# Patient Record
Sex: Male | Born: 1990 | Race: White | Hispanic: Yes | Marital: Married | Smoking: Never smoker
Health system: Southern US, Community
[De-identification: ages and names within clinical notes are randomized; demographics above are authoritative.]

## PROBLEM LIST (undated history)

## (undated) DIAGNOSIS — N3289 Other specified disorders of bladder: Secondary | ICD-10-CM

## (undated) DIAGNOSIS — K589 Irritable bowel syndrome without diarrhea: Secondary | ICD-10-CM

## (undated) HISTORY — DX: Other specified disorders of bladder: N32.89

## (undated) HISTORY — DX: Irritable bowel syndrome, unspecified: K58.9

---
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
## 2011-04-02 NOTE — Discharge Summary (Signed)
NAMEMAVERYK, Brandon White NO.:  [id number]   MEDICAL RECORD NO.:  [id number]          PATIENT TYPE:  INP   LOCATION:  6124                         FACILITY:  [facility]   PHYSICIAN:  Joesph July, MD    DATE OF BIRTH:  [birth year]   DATE OF ADMISSION:  09/27/2008  DATE OF DISCHARGE:  09/29/2008                               DISCHARGE SUMMARY   REASON FOR HOSPITALIZATION:  Transaminitis and abdominal pain.   SIGNIFICANT FINDINGS:  The patient presented with positive monospot  test, increased LFTs, jaundice, and abdominal pain.  The patient had  zero fevers throughout hospitalization.   INITIAL LABS:  White blood cell 22.2, hemoglobin/hematocrit 13.3/39.9,  platelets 269, T bili 7.6, alk phos 774, AST 191, ALT 199.  Labs  continue to remain stable over the course of hospitalization with a T  bili of 6.1, direct bili of 3.8, AST of 184, ALT 204, and total protein  is 7.1.  All tests were trending downward at discharge.  Pain greatly  decreased at discharge.   TREATMENT:  Maintenance IV fluids and observation.  Operation and  procedures.  Ultrasound showed a gallbladder thickness to 9 mm and  hepatosplenomegaly.   FINAL DIAGNOSIS:  Epstein-Barr virus-associated acute acalculous  cholecystitis.   DISCHARGE MEDICATIONS AND INSTRUCTIONS:  Continue low-fat diet.  Avoid  fatty and greasy food.  Limit Tylenol usage until return of liver  function tests to normal and avoid contact sports for 6 weeks and once  cleared by his primary care physician.  Pending results, blood culture,  and hepatitis C RNA.  Follow up with Dr. Chestine Spore at [phone] and at  [facility] on October 03, 2008, at 10:20 a.m. [phone].  He will  also have follow-up in the GI clinic after discharge.   DISCHARGE WEIGHT:  60 kilograms.   DISCHARGE CONDITION:  Improved.      Pediatrics Resident      Joesph July, MD  Electronically Signed   PR/MEDQ  D:  09/29/2008  T:  09/30/2008  Job:   [id number]   cc:   Marylu Lund L. Avis Epley, M.D.  Dr. Chestine Spore

## 2011-04-05 NOTE — Op Note (Signed)
Brandon White, Brandon White NO.:  [id number]   MEDICAL RECORD NO.:  [id number]          PATIENT TYPE:  INP   LOCATION:  6126                         FACILITY:  [facility]   PHYSICIAN:  Leonia Corona, M.D.  DATE OF BIRTH:  Oct 16, 1991   DATE OF PROCEDURE:  DATE OF DISCHARGE:                                 OPERATIVE REPORT   PREOPERATIVE DIAGNOSIS:  Acute appendicitis.   POSTOPERATIVE DIAGNOSIS:  Acute appendicitis.   PROCEDURE PERFORMED:  Open appendectomy.   SURGEON:  Leonia Corona, M.D.   ASSISTANT:  Nurse.   ANESTHESIA:  General endotracheal tube anesthesia.   INDICATIONS FOR THE PROCEDURE:  This 20 year old male child was evaluated  for abdominal pain.  A high suspicion of appendicitis was made on clinical  exam.  The diagnosis of acute appendicitis was conformed by CT scan, hence  the indications for the procedure.   PROCEDURE IN DETAIL:  The patient was brought to the operating room and  placed supine on the operating table.  General endotracheal tube anesthesia  was given.  The right lower quadrant of the abdomen and the surrounding area  of the abdominal wall skin were prepped and draped in the usual manner.  The  incision was centered at the McBurney's point in the right lower quadrant  and extended on either side for about 1.5-2 cm.  The incision was deepened  through the subcutaneous tissue using electrocautery until the external  aponeurosis was reached, which was incised in the line of its fibers with  the help of knife and scissors.  The internal oblique and transversus  abdominis muscles were split along its fibers with the help of blunt  hemostat and stretched with the help of retractors until the peritoneum was  visualized, which was held between two hemostats and incised in between with  scissors.  A small opening into the peritoneal cavity was made and extended  with the help of scissors.  Just beneath the incision, the cecum was  visualized,  which was covered with fibrinous exudate, indicating  inflammatory process.  The cecum was held up with Babcock forceps and  partially delivered out of the incision.  The teniae were followed which led  to a very severely inflamed appendix which was wrapped and covered with  omentum.  A lot of inflammatory fibrinous exudate was present around the  appendix.  The mesoappendix was edematous, and the tip of the appendix was  bulbous and swollen.  The appendix was delivered out of the incision, along  with partial delivery of the cecum.  The mesoappendix was divided between  clamps and ligated using 2-0 silk until the base of the appendix was  cleared.  The base of the appendix was crushed and clamped above the base.  The base was ligated using 2-0 Vicryl, and the appendix was divided above  the location and removed from the field.  The mucosa of the appendicular  stump was cauterized.  A pursestring suture was placed on the cecal wall  around the base of the appendix, and the base of the appendix was buried  under the pursestring suture.  Cecum was delivered back into the peritoneal  cavity.  A thorough irrigation of the peritoneal cavity was done using warm  saline until the returning fluid was clear.  No evidence of active bleeding  or oozing was noted.  Abdomen was then closed in layers, the peritoneum  using 2-0 Vicryl running stitch.  Internal oblique and transversus abdominis  muscles were approximated using single interrupted suture of 2-0 Vicryl.  The external oblique was repaired using 2-0 Vicryl interrupted stitches.  Wound was irrigated, and approximately 10 mL of 0.25% Marcaine with  epinephrine was insufflated in and around the incision for postoperative  pain control.  The skin was then closed with 4-0 Monocryl subcuticular  stitch.  Steri-Strips were applied and covered with sterile gauze and  Tegaderm dressings.  The patient tolerated the very procedure well.  It was  smooth  and uneventful.  The patient was later extubated and transported to  the recovery room in good stable condition.      SF/MEDQ  D:  12/07/2004  T:  12/07/2004  Job:  [id number]   cc:   Georgann Housekeeper, MD  Fax: [phone]

## 2011-04-05 NOTE — Discharge Summary (Signed)
NAMEAYRTON, MCVAY NO.:  [id number]   MEDICAL RECORD NO.:  [id number]          PATIENT TYPE:  INP   LOCATION:  6126                         FACILITY:  [facility]   PHYSICIAN:  Pediatrics Resident    DATE OF BIRTH:  01-12-1991   DATE OF ADMISSION:  12/06/2004  DATE OF DISCHARGE:  12/08/2004                                 DISCHARGE SUMMARY   REASON FOR ADMISSION:  This is a 20 year old male otherwise healthy admitted  status post appendectomy.   SIGNIFICANT FINDINGS:  Admission labs revealed urine within normal limits  except for a spec. grav. of 1.  Chemistries were within normal limits.  CBC  showed white count 17.9, hemoglobin 15.4, hematocrit 44.7, and platelets  375.  CT obtained on January 19 showed dilated, inflamed appendix compatible  with appendicitis.   TREATMENT:  Open appendectomy, IV fluids, Unasyn 1.5 grams IV q.6h. x 4  doses total, morphine p.r.n. for pain, schedule Tylenol with codeine q.8h.  and Tylenol p.r.n. for pain.   OPERATIONS AND PROCEDURES:  Open appendectomy without complications.   FINAL DIAGNOSIS:  Appendicitis.   DISCHARGE MEDICATIONS AND INSTRUCTIONS:  Tylenol 650 mg p.o. q.4-6h. p.r.n.  pain, if pain not relieved by regular Tylenol, can use Tylenol with codeine  elixir 10 mL p.o. q.6-8h. p.r.n.  Return to the ED for temperatures greater  than 38.5 or any other symptoms of concern.   PENDING RESULTS OR ISSUES TO BE FOLLOWED:  None.   FOLLOW UP:  Pediatric surgery in ten days, call for appointment.   CONDITION ON DISCHARGE:  Stable.       PR/MEDQ  D:  12/08/2004  T:  12/08/2004  Job:  [id number]

## 2011-08-20 LAB — COMPREHENSIVE METABOLIC PANEL
ALT: 199 — ABNORMAL HIGH
ALT: 263 — ABNORMAL HIGH
AST: 188 — ABNORMAL HIGH
AST: 191 — ABNORMAL HIGH
Albumin: 2.8 — ABNORMAL LOW
Albumin: 2.9 — ABNORMAL LOW
Albumin: 3.6
Alkaline Phosphatase: 358 — ABNORMAL HIGH
Alkaline Phosphatase: 774 — ABNORMAL HIGH
Alkaline Phosphatase: 825 — ABNORMAL HIGH
BUN: 5 — ABNORMAL LOW
BUN: 6
BUN: 7
BUN: 8
CO2: 25
CO2: 26
CO2: 28
Calcium: 8.9
Calcium: 9
Calcium: 9.3
Chloride: 101
Chloride: 101
Chloride: 98
Creatinine, Ser: 0.71
Creatinine, Ser: 0.8
Creatinine, Ser: 0.83
Creatinine, Ser: 0.86
Glucose, Bld: 83
Glucose, Bld: 84
Glucose, Bld: 91
Potassium: 4.6
Potassium: 4.6
Potassium: 5.9 — ABNORMAL HIGH
Sodium: 133 — ABNORMAL LOW
Sodium: 136
Total Bilirubin: 3.9 — ABNORMAL HIGH
Total Bilirubin: 4 — ABNORMAL HIGH
Total Bilirubin: 7.6 — ABNORMAL HIGH
Total Protein: 6.8
Total Protein: 6.9
Total Protein: 7.1

## 2011-08-20 LAB — DIFFERENTIAL
Band Neutrophils: 0
Band Neutrophils: 11 — ABNORMAL HIGH
Basophils Absolute: 0
Basophils Absolute: 0
Basophils Relative: 0
Basophils Relative: 0
Basophils Relative: 0
Basophils Relative: 1
Basophils Relative: 2 — ABNORMAL HIGH
Blasts: 0
Blasts: 0
Blasts: 0
Eosinophils Absolute: 0
Eosinophils Absolute: 0.2
Eosinophils Absolute: 0.2
Eosinophils Relative: 0
Eosinophils Relative: 1
Eosinophils Relative: 1
Eosinophils Relative: 1
Lymphocytes Relative: 43
Lymphocytes Relative: 57 — ABNORMAL HIGH
Lymphocytes Relative: 73 — ABNORMAL HIGH
Lymphocytes Relative: 74 — ABNORMAL HIGH
Lymphocytes Relative: 85 — ABNORMAL HIGH
Lymphs Abs: 18.9 — ABNORMAL HIGH
Lymphs Abs: 3.7
Lymphs Abs: 9 — ABNORMAL HIGH
Metamyelocytes Relative: 0
Metamyelocytes Relative: 0
Monocytes Absolute: 0.4
Monocytes Absolute: 0.8
Monocytes Absolute: 2.1 — ABNORMAL HIGH
Monocytes Relative: 2 — ABNORMAL LOW
Monocytes Relative: 6
Monocytes Relative: 7
Monocytes Relative: 9
Myelocytes: 0
Myelocytes: 0
Neutro Abs: 2.7
Neutro Abs: 3.2
Neutro Abs: 3.4
Neutro Abs: 3.8
Neutro Abs: 4
Neutro Abs: 4.9
Neutrophils Relative %: 12 — ABNORMAL LOW
Neutrophils Relative %: 17 — ABNORMAL LOW
Neutrophils Relative %: 18 — ABNORMAL LOW
Neutrophils Relative %: 20 — ABNORMAL LOW
Neutrophils Relative %: 31 — ABNORMAL LOW
Neutrophils Relative %: 37 — ABNORMAL LOW
Promyelocytes Absolute: 0
Promyelocytes Absolute: 0
Promyelocytes Absolute: 0
Smear Review: ADEQUATE
nRBC: 0
nRBC: 0

## 2011-08-20 LAB — GC/CHLAMYDIA PROBE AMP, URINE
Chlamydia, Swab/Urine, PCR: NEGATIVE
GC Probe Amp, Urine: NEGATIVE

## 2011-08-20 LAB — CBC
HCT: 35.7 — ABNORMAL LOW
HCT: 36.6
HCT: 38.6
HCT: 39.3
HCT: 39.8
HCT: 46.1
Hemoglobin: 12.8
Hemoglobin: 13.3
Hemoglobin: 13.5
Hemoglobin: 13.7
Hemoglobin: 14.9
MCHC: 32.3
MCHC: 33.9
MCHC: 33.9
MCHC: 35.5
MCV: 83.6
MCV: 85.8
MCV: 86.4
MCV: 88.2
MCV: 92.4
Platelets: 181
Platelets: 269
Platelets: 389
RBC: 3.98
RBC: 4.18
RBC: 4.27
RBC: 4.51
RBC: 4.55
RBC: 5.38
RDW: 14
RDW: 15
RDW: 15.4
WBC: 15.8 — ABNORMAL HIGH
WBC: 22.2 — ABNORMAL HIGH
WBC: 8.6

## 2011-08-20 LAB — URINALYSIS, ROUTINE W REFLEX MICROSCOPIC
Glucose, UA: NEGATIVE
Hgb urine dipstick: NEGATIVE
Ketones, ur: NEGATIVE
Nitrite: NEGATIVE
Protein, ur: NEGATIVE
Specific Gravity, Urine: 1.02
Urobilinogen, UA: 1
pH: 6

## 2011-08-20 LAB — HEPATIC FUNCTION PANEL
ALT: 195 — ABNORMAL HIGH
ALT: 204 — ABNORMAL HIGH
AST: 185 — ABNORMAL HIGH
Albumin: 2.6 — ABNORMAL LOW
Alkaline Phosphatase: 840 — ABNORMAL HIGH
Alkaline Phosphatase: 906 — ABNORMAL HIGH
Indirect Bilirubin: 2.3 — ABNORMAL HIGH
Total Bilirubin: 6.4 — ABNORMAL HIGH
Total Protein: 7.1

## 2011-08-20 LAB — URINE CULTURE
Colony Count: NO GROWTH
Culture: NO GROWTH

## 2011-08-20 LAB — HEPATITIS B CORE ANTIBODY, IGM: Hep B C IgM: NEGATIVE

## 2011-08-20 LAB — HEPATITIS C VRS RNA DETECT BY PCR-QUAL: Hepatitis C Vrs RNA by PCR-Qual: NEGATIVE

## 2011-08-20 LAB — RAPID URINE DRUG SCREEN, HOSP PERFORMED
Amphetamines: NOT DETECTED
Benzodiazepines: NOT DETECTED
Cocaine: NOT DETECTED
Tetrahydrocannabinol: NOT DETECTED

## 2011-08-20 LAB — CULTURE, BLOOD (SINGLE): Culture: NO GROWTH

## 2011-08-20 LAB — URINE MICROSCOPIC-ADD ON

## 2011-08-20 LAB — BILIRUBIN, FRACTIONATED(TOT/DIR/INDIR)
Bilirubin, Direct: 4.7 — ABNORMAL HIGH
Indirect Bilirubin: 3.6 — ABNORMAL HIGH

## 2011-08-20 LAB — ACETAMINOPHEN LEVEL: Acetaminophen (Tylenol), Serum: <10 — ABNORMAL LOW

## 2011-08-20 LAB — RETICULOCYTES
RBC.: 4.44
Retic Count, Absolute: 71
Retic Ct Pct: 1.6

## 2011-08-20 LAB — LIPASE, BLOOD: Lipase: 24

## 2011-08-20 LAB — CMV ABS, IGG+IGM (CYTOMEGALOVIRUS): CMV IgM: <8 AU/mL (ref <30.0)

## 2011-08-20 LAB — PROTIME-INR: INR: 1

## 2011-08-20 LAB — MONONUCLEOSIS SCREEN
Mono Screen: POSITIVE — AB
Mono Screen: POSITIVE — AB

## 2011-08-20 LAB — EBV AB TO VIRAL CAPSID AG PNL, IGG+IGM
EBV VCA IgG: 0.5
EBV VCA IgM: 6.81 — ABNORMAL HIGH

## 2012-06-04 ENCOUNTER — Other Ambulatory Visit: Payer: Self-pay | Admitting: Family Medicine

## 2012-06-04 DIAGNOSIS — R1011 Right upper quadrant pain: Secondary | ICD-10-CM

## 2012-06-05 ENCOUNTER — Ambulatory Visit
Admission: RE | Admit: 2012-06-05 | Discharge: 2012-06-05 | Disposition: A | Discharge status: Home / Self Care | Payer: 59 | Source: Ambulatory Visit | Attending: Family Medicine | Admitting: Family Medicine

## 2012-06-05 DIAGNOSIS — R1011 Right upper quadrant pain: Secondary | ICD-10-CM

## 2018-05-28 DIAGNOSIS — R030 Elevated blood-pressure reading, without diagnosis of hypertension: Secondary | ICD-10-CM | POA: Diagnosis not present

## 2018-05-28 DIAGNOSIS — K589 Irritable bowel syndrome without diarrhea: Secondary | ICD-10-CM | POA: Diagnosis not present

## 2018-05-28 DIAGNOSIS — R1013 Epigastric pain: Secondary | ICD-10-CM | POA: Diagnosis not present

## 2019-10-08 ENCOUNTER — Other Ambulatory Visit: Payer: Self-pay

## 2019-10-08 DIAGNOSIS — Z20822 Contact with and (suspected) exposure to covid-19: Secondary | ICD-10-CM

## 2019-10-11 LAB — NOVEL CORONAVIRUS, NAA: SARS-CoV-2, NAA: NOT DETECTED

## 2019-11-25 ENCOUNTER — Ambulatory Visit: Payer: 59 | Attending: Internal Medicine

## 2020-10-13 ENCOUNTER — Emergency Department (HOSPITAL_COMMUNITY)
Admission: EM | Admit: 2020-10-13 | Discharge: 2020-10-14 | Disposition: A | Discharge status: Home / Self Care | Payer: 59 | Attending: Emergency Medicine | Admitting: Emergency Medicine

## 2020-10-13 ENCOUNTER — Other Ambulatory Visit: Payer: Self-pay

## 2020-10-13 ENCOUNTER — Encounter (HOSPITAL_COMMUNITY): Payer: Self-pay

## 2020-10-13 DIAGNOSIS — N50811 Right testicular pain: Secondary | ICD-10-CM | POA: Insufficient documentation

## 2020-10-13 NOTE — ED Triage Notes (Signed)
Pt reports right testicle pain and burning on going for a month. Pt seen by urology. Had CT scan, and STD work up. Pt given shot of ceftriaxone, and took full levofloxacin, and doxycycline dose.

## 2020-10-14 ENCOUNTER — Emergency Department (HOSPITAL_COMMUNITY): Payer: 59

## 2020-10-14 MED ORDER — HYDROCODONE-ACETAMINOPHEN 5-325 MG PO TABS
1.0000 | ORAL_TABLET | Freq: Once | ORAL | Status: AC
  Administered 2020-10-14: 1 via ORAL
  Filled 2020-10-14: qty 1

## 2020-10-14 MED ORDER — HYDROCODONE-ACETAMINOPHEN 5-325 MG PO TABS
1.0000 | ORAL_TABLET | ORAL | 0 refills | Status: DC | PRN
Start: 2020-10-14 — End: 2021-10-08

## 2020-10-14 NOTE — ED Provider Notes (Signed)
[facility]-EMERGENCY DEPT [facility] Emergency Department Provider Note MRN:  [id number]  Arrival date & time: 10/14/20     Chief Complaint   Testicle Pain   History of Present Illness   Brandon White is a 29 y.o. year-old male with no pertinent past medical history presenting to the ED with chief complaint of testicle pain.  1 month of persistent lower pelvic discomfort and testicular/penile burning sensation.  Has been trialed on multiple antibiotics without much help.  Over the past month has had only 5 symptom-free days.  Pain getting worse recently.  Has been started on doxycycline by [facility], has been on this for 7 days.  Initially was helping but now pain has returned.  Has been diagnosed with epididymitis.  Denies fever, no nausea vomiting, no chest pain or shortness of breath, no upper abdominal pain, no other complaints.  Had a CT scan a few days ago that was normal.  Review of Systems  A complete 10 system review of systems was obtained and all systems are negative except as noted in the HPI and PMH.   Patient's Health History   History reviewed. No pertinent past medical history.  History reviewed. No pertinent surgical history.  No family history on file.  Social History   Socioeconomic History   Marital status: Married    Spouse name: Not on file   Number of children: Not on file   Years of education: Not on file   Highest education level: Not on file  Occupational History   Not on file  Tobacco Use   Smoking status: Never Smoker   Smokeless tobacco: Never Used  Substance and Sexual Activity   Alcohol use: Not Currently   Drug use: Not Currently   Sexual activity: Not on file  Other Topics Concern   Not on file  Social History Narrative   Not on file   Social Determinants of Health   Financial Resource Strain:    Difficulty of Paying Living Expenses: Not on file  Food Insecurity:    Worried About Running Out of Food in the Last  Year: Not on file   Ran Out of Food in the Last Year: Not on file  Transportation Needs:    Lack of Transportation (Medical): Not on file   Lack of Transportation (Non-Medical): Not on file  Physical Activity:    Days of Exercise per Week: Not on file   Minutes of Exercise per Session: Not on file  Stress:    Feeling of Stress : Not on file  Social Connections:    Frequency of Communication with Friends and Family: Not on file   Frequency of Social Gatherings with Friends and Family: Not on file   Attends Religious Services: Not on file   Active Member of Clubs or Organizations: Not on file   Attends Banker Meetings: Not on file   Marital Status: Not on file  Intimate Partner Violence:    Fear of Current or Ex-Partner: Not on file   Emotionally Abused: Not on file   Physically Abused: Not on file   Sexually Abused: Not on file     Physical Exam   Vitals:   10/13/20 2320  BP: (!) 160/94  Pulse: 62  Resp: 18  Temp: 98.6 F (37 C)  SpO2: 100%    CONSTITUTIONAL: Well-appearing, NAD NEURO:  Alert and oriented x 3, no focal deficits EYES:  eyes equal and reactive ENT/NECK:  no LAD, no JVD CARDIO: Regular rate,  well-perfused, normal S1 and S2 PULM:  CTAB no wheezing or rhonchi GI/GU:  normal bowel sounds, non-distended, non-tender; normal appearing external genitalia, no lymphadenopathy, testicles with normal lie, right testicle is tender to palpation MSK/SPINE:  No gross deformities, no edema SKIN:  no rash, atraumatic PSYCH:  Appropriate speech and behavior  *Additional and/or pertinent findings included in MDM below  Diagnostic and Interventional Summary    EKG Interpretation  Date/Time:    Ventricular Rate:    PR Interval:    QRS Duration:   QT Interval:    QTC Calculation:   R Axis:     Text Interpretation:        Labs Reviewed - No data to display  US SCROTUM W/DOPPLER  Final Result      Medications - No data to display     Procedures  /  Critical Care Procedures  ED Course and Medical Decision Making  I have reviewed the triage vital signs, the nursing notes, and pertinent available records from the EMR.  Listed above are laboratory and imaging tests that I personally ordered, reviewed, and interpreted and then considered in my medical decision making (see below for details).  Chronic dysuria, normal vital signs, largely benign exam with the exception of tender right testicle.  Will ultrasound to exclude structural abnormality or impaired blood flow, but mostly there is no evidence of emergent condition and patient likely needs continued [facility] follow-up.       Elmer Sow. Pilar Plate, MD [facility] Emergency Medicine [facility] [email or web address]  Final Clinical Impressions(s) / ED Diagnoses     ICD-10-CM   1. Pain in right testicle  N50.811     ED Discharge Orders         Ordered    HYDROcodone-acetaminophen (NORCO/VICODIN) 5-325 MG tablet  Every 4 hours PRN        10/14/20 0137           Discharge Instructions Discussed with and Provided to Patient:     Discharge Instructions     You were evaluated in the Emergency Department and after careful evaluation, we did not find any emergent condition requiring admission or further testing in the hospital.  Your exam/testing today was overall reassuring.  We recommend continued follow-up with [facility] for further management.  You can use the Norco pain medication as needed if you are having trouble sleeping at night due to the discomfort.  Please return to the Emergency Department if you experience any worsening of your condition.  Thank you for allowing [place] to be a part of your care.        Sabas Sous, MD 10/14/20 [phone]

## 2020-10-14 NOTE — Discharge Instructions (Addendum)
You were evaluated in the Emergency Department and after careful evaluation, we did not find any emergent condition requiring admission or further testing in the hospital.  Your exam/testing today was overall reassuring.  We recommend continued follow-up with [facility] for further management.  You can use the Norco pain medication as needed if you are having trouble sleeping at night due to the discomfort.  Please return to the Emergency Department if you experience any worsening of your condition.  Thank you for allowing [place] to be a part of your care.

## 2020-10-26 ENCOUNTER — Ambulatory Visit (INDEPENDENT_AMBULATORY_CARE_PROVIDER_SITE_OTHER): Payer: 59 | Admitting: Psychiatry

## 2020-10-26 ENCOUNTER — Other Ambulatory Visit: Payer: Self-pay

## 2020-10-26 ENCOUNTER — Encounter: Payer: Self-pay | Admitting: Psychiatry

## 2020-10-26 VITALS — BP 125/75 | HR 81 | Ht 70.0 in | Wt 158.0 lb

## 2020-10-26 DIAGNOSIS — F428 Other obsessive-compulsive disorder: Secondary | ICD-10-CM | POA: Diagnosis not present

## 2020-10-26 DIAGNOSIS — F5105 Insomnia due to other mental disorder: Secondary | ICD-10-CM

## 2020-10-26 DIAGNOSIS — F330 Major depressive disorder, recurrent, mild: Secondary | ICD-10-CM

## 2020-10-26 MED ORDER — FLUOXETINE HCL 20 MG PO CAPS: ORAL_CAPSULE | ORAL | 1 refills | Status: DC

## 2020-10-26 MED ORDER — ZOLPIDEM TARTRATE 10 MG PO TABS: 10.0000 mg | ORAL_TABLET | Freq: Every evening | ORAL | 0 refills | Status: DC | PRN
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
## 2020-10-26 NOTE — Patient Instructions (Addendum)
Fabian Sharp, [facility]  Counselor for OCD [employer], phd [facility] [employer], CSW

## 2020-10-30 ENCOUNTER — Telehealth: Payer: Self-pay | Admitting: Psychiatry

## 2020-10-30 MED ORDER — FLUOXETINE HCL 10 MG PO CAPS: ORAL_CAPSULE | ORAL | 0 refills | Status: DC

## 2020-10-30 NOTE — Telephone Encounter (Signed)
Zyshonne called to report that the Prozac is causing more depression.  He is feeling less responsive to everything, fatigued, low energy.  Please call to discuss options.

## 2020-10-30 NOTE — Telephone Encounter (Signed)
RTC   Brandon White called to report that the Prozac is causing more depression.  He is feeling less responsive to everything, fatigued, low energy.  Please call to discuss options.  We will therefore reduce dosage of Prozac from 20 mg to 10 mg a day and proceed more slowly.  Informed him that it will take the medicine several weeks to work.  He agrees  Meredith Staggers MD, DFAPA

## 2020-11-01 ENCOUNTER — Telehealth: Payer: Self-pay | Admitting: Psychiatry

## 2020-11-01 MED ORDER — LORAZEPAM 0.5 MG PO TABS: 0.5000 mg | ORAL_TABLET | Freq: Three times a day (TID) | ORAL | 0 refills | Status: DC | PRN

## 2020-11-01 NOTE — Telephone Encounter (Signed)
Brandon White, wife, called for [name] reporting that he is having panic attacks.  They want to know if you will prescribe an additional medication that is faster acting so he can get some immediate help while waiting for the Prozac to take affect.  [facility] [address].

## 2020-11-01 NOTE — Telephone Encounter (Signed)
FYI, sent RX for Ativan for panic

## 2020-11-23 ENCOUNTER — Other Ambulatory Visit: Payer: Self-pay | Admitting: Psychiatry

## 2020-11-27 ENCOUNTER — Other Ambulatory Visit: Payer: Self-pay | Admitting: Psychiatry

## 2020-12-13 ENCOUNTER — Other Ambulatory Visit: Payer: Self-pay | Admitting: Psychiatry

## 2020-12-13 DIAGNOSIS — F5105 Insomnia due to other mental disorder: Secondary | ICD-10-CM

## 2020-12-19 ENCOUNTER — Telehealth (INDEPENDENT_AMBULATORY_CARE_PROVIDER_SITE_OTHER): Payer: 59 | Admitting: Psychiatry

## 2020-12-19 ENCOUNTER — Encounter: Payer: Self-pay | Admitting: Psychiatry

## 2020-12-19 DIAGNOSIS — M6289 Other specified disorders of muscle: Secondary | ICD-10-CM

## 2020-12-19 DIAGNOSIS — F4001 Agoraphobia with panic disorder: Secondary | ICD-10-CM | POA: Diagnosis not present

## 2020-12-19 DIAGNOSIS — F428 Other obsessive-compulsive disorder: Secondary | ICD-10-CM

## 2020-12-19 DIAGNOSIS — F5105 Insomnia due to other mental disorder: Secondary | ICD-10-CM

## 2020-12-19 DIAGNOSIS — F330 Major depressive disorder, recurrent, mild: Secondary | ICD-10-CM

## 2020-12-19 MED ORDER — DIAZEPAM 5 MG PO TABS: 5.0000 mg | ORAL_TABLET | Freq: Three times a day (TID) | ORAL | 1 refills | Status: DC | PRN

## 2020-12-19 NOTE — Progress Notes (Signed)
Brandon White [id number] 1991-06-06 29 y.o.  Video Visit via My Chart  I connected with pt by My Chart and verified that I am speaking with the correct person using two identifiers.   I discussed the limitations, risks, security and privacy concerns of performing an evaluation and management service by My Chart  and the availability of in person appointments. I also discussed with the patient that there may be a patient responsible charge related to this service. The patient expressed understanding and agreed to proceed.  I discussed the assessment and treatment plan with the patient. The patient was provided an opportunity to ask questions and all were answered. The patient agreed with the plan and demonstrated an understanding of the instructions.   The patient was advised to call back or seek an in-person evaluation if the symptoms worsen or if the condition fails to improve as anticipated.  I provided 30 minutes of video time during this encounter.  The patient was located at home and the provider was located office. Session started 1040 and I will ended at 1110  Subjective:   Patient ID:  Brandon White is a 30 y.o. (DOB 1990-12-13) male.  Chief Complaint:  Chief Complaint  Patient presents with  . Follow-up  . Anxiety  . Depression    HPI Brandon White presents to the office today for follow-up of anxiety.  First seen 10/26/20 and started fluoxetine.Marland Kitchen Up to 30 mg fluoxetine about a month. Over all is better.  No longer feels weird on the SSRI and no SE.  Definitely better mood.  Still some intrusive thoughts.  Less irritable and depressed. Taking Ambien regularly and it helps.  But takes longer to fall asleep.  Average 7 hours. Takes lorazepam 0.5 mg daily and it helps.   Had panic but not now.   Review of Systems:  Review of Systems  Cardiovascular: Negative for chest pain and palpitations.  Genitourinary: Positive for difficulty urinating and frequency.   Neurological: Negative for tremors and weakness.  Uro workup normal Done PT pelvic floor exercises  Medications: I have reviewed the patient's current medications.  Current Outpatient Medications  Medication Sig Dispense Refill  . diazepam (VALIUM) 5 MG tablet Take 1 tablet (5 mg total) by mouth every 8 (eight) hours as needed for anxiety. 60 tablet 1  . doxycycline (VIBRAMYCIN) 100 MG capsule Take 100 mg by mouth 2 (two) times daily.    Marland Kitchen FLUoxetine (PROZAC) 10 MG capsule TAKE 1 CAPSULE DAILY FOR 1 WEEK, THEN 2 CAPS DAILY FOR 2 WEEKS, THEN 3 CAPS DAILY THEREAFTER 270 capsule 0  . HYDROcodone-acetaminophen (NORCO/VICODIN) 5-325 MG tablet Take 1 tablet by mouth every 4 (four) hours as needed. 6 tablet 0  . Meth-Hyo-M Bl-Na Phos-Ph Sal (URO-MP) 118 MG CAPS Take by mouth.    . sulfamethoxazole-trimethoprim (BACTRIM DS) 800-160 MG tablet Take 1 tablet by mouth 2 (two) times daily.    . tamsulosin (FLOMAX) 0.4 MG CAPS capsule Take 0.4 mg by mouth daily.    Marland Kitchen tolterodine (DETROL LA) 4 MG 24 hr capsule Take 4 mg by mouth daily.    Marland Kitchen zolpidem (AMBIEN) 10 MG tablet TAKE 1 TABLET BY MOUTH AT BEDTIME AS NEEDED FOR SLEEP. 30 tablet 0   No current facility-administered medications for this visit.    Medication Side Effects: None  Allergies: No Known Allergies  Past Medical History:  Diagnosis Date  . Bladder spasm   . Irritable bowel syndrome     Family History  Problem  Relation Age of Onset  . Anxiety disorder Mother   . Depression Mother   . Anxiety disorder Father   . Depression Father   . Anxiety disorder Sister     Social History   Socioeconomic History  . Marital status: Married    Spouse name: Not on file  . Number of children: Not on file  . Years of education: Not on file  . Highest education level: Not on file  Occupational History  . Not on file  Tobacco Use  . Smoking status: Never Smoker  . Smokeless tobacco: Never Used  Substance and Sexual Activity  . Alcohol  use: Not Currently  . Drug use: Not Currently  . Sexual activity: Not on file  Other Topics Concern  . Not on file  Social History Narrative  . Not on file   Social Determinants of Health   Financial Resource Strain: Not on file  Food Insecurity: Not on file  Transportation Needs: Not on file  Physical Activity: Not on file  Stress: Not on file  Social Connections: Not on file  Intimate Partner Violence: Not on file    Past Medical History, Surgical history, Social history, and Family history were reviewed and updated as appropriate.   Please see review of systems for further details on the patient's review from today.   Objective:   Physical Exam:  There were no vitals taken for this visit.  Physical Exam Constitutional:      General: He is not in acute distress. Musculoskeletal:        General: No deformity.  Neurological:     Mental Status: He is alert and oriented to person, place, and time.     Coordination: Coordination normal.  Psychiatric:        Attention and Perception: Attention and perception normal. He does not perceive auditory or visual hallucinations.        Mood and Affect: Mood is anxious. Mood is not depressed. Affect is not labile, blunt, angry or inappropriate.        Speech: Speech normal.        Behavior: Behavior normal.        Thought Content: Thought content normal. Thought content is not paranoid or delusional. Thought content does not include homicidal or suicidal ideation. Thought content does not include homicidal or suicidal plan.        Cognition and Memory: Cognition and memory normal.        Judgment: Judgment normal.     Comments: Insight intact Still intrusive thoughts. Minimal depression and was quite depressed.     Lab Review:     Component Value Date/Time   NA 134 (L) 10/02/2008 0149   K 4.2 10/02/2008 0149   CL 101 10/02/2008 0149   CO2 28 10/02/2008 0149   GLUCOSE 103 (H) 10/02/2008 0149   BUN 8 10/02/2008 0149    CREATININE 0.86 10/02/2008 0149   CALCIUM 8.9 10/02/2008 0149   PROT 7.9 10/06/2008 2026   ALBUMIN 4.1 10/06/2008 2026   AST 73 (H) 10/06/2008 2026   ALT 127 (H) 10/06/2008 2026   ALKPHOS 624 (H) 10/06/2008 2026   BILITOT 2.6 (H) 10/06/2008 2026   GFRNONAA NOT CALCULATED 10/02/2008 0149   GFRAA  10/02/2008 0149    NOT CALCULATED        The eGFR has been calculated using the MDRD equation. This calculation has not been validated in all clinical       Component Value Date/Time  WBC 15.8 (H) 10/02/2008 0149   RBC 4.27 10/02/2008 0149   HGB 12.4 10/02/2008 0149   HCT 35.7 (L) 10/02/2008 0149   PLT 389 10/02/2008 0149   MCV 83.6 10/02/2008 0149   MCHC 34.7 10/02/2008 0149   RDW 15.9 (H) 10/02/2008 0149   LYMPHSABS 9.0 (H) 10/02/2008 0149   MONOABS 1.7 (H) 10/02/2008 0149   EOSABS 0.0 10/02/2008 0149   BASOSABS 0.0 10/02/2008 0149    No results found for: POCLITH, LITHIUM   No results found for: PHENYTOIN, PHENOBARB, VALPROATE, CBMZ   .res Assessment: Plan:    Brandon White was seen today for follow-up, anxiety and depression.  Diagnoses and all orders for this visit:  Other obsessive-compulsive disorders  Depression, major, recurrent, mild (HCC)  Panic disorder with agoraphobia -     diazepam (VALIUM) 5 MG tablet; Take 1 tablet (5 mg total) by mouth every 8 (eight) hours as needed for anxiety.  Insomnia due to mental condition  Pelvic floor dysfunction -     diazepam (VALIUM) 5 MG tablet; Take 1 tablet (5 mg total) by mouth every 8 (eight) hours as needed for anxiety.   DC lorazepam.    Asks about Valium for pelvic floor tension.  Ok trial  Up to 5 mg TID prn pain.   We discussed the short-term risks associated with benzodiazepines including sedation and increased fall risk among others.  Discussed long-term side effect risk including dependence, potential withdrawal symptoms, and the potential eventual dose-related risk of dementia.  But recent studies from 2020  dispute this association between benzodiazepines and dementia risk. Newer studies in 2020 do not support an association with dementia.  Increase fluoxetine to 40 mg for 2 weeks, then in crease to 60 mg daily. Disc SE.  He agrees.  60 mg is more typical dose in OCD and faster we get there the faster he'll improve.  FU  8-10 weeks.  Call prn.  Lynder Parents, MD, DFAPA      Please see After Visit Summary for patient specific instructions.  No future appointments.  No orders of the defined types were placed in this encounter.   -------------------------------

## 2020-12-24 ENCOUNTER — Other Ambulatory Visit: Payer: Self-pay | Admitting: Psychiatry

## 2020-12-25 ENCOUNTER — Other Ambulatory Visit: Payer: Self-pay | Admitting: Psychiatry

## 2020-12-25 DIAGNOSIS — F428 Other obsessive-compulsive disorder: Secondary | ICD-10-CM

## 2020-12-25 DIAGNOSIS — F330 Major depressive disorder, recurrent, mild: Secondary | ICD-10-CM

## 2021-01-15 ENCOUNTER — Other Ambulatory Visit: Payer: Self-pay | Admitting: Psychiatry

## 2021-01-15 DIAGNOSIS — F5105 Insomnia due to other mental disorder: Secondary | ICD-10-CM

## 2021-02-15 ENCOUNTER — Other Ambulatory Visit: Payer: Self-pay | Admitting: Psychiatry

## 2021-02-15 DIAGNOSIS — F5105 Insomnia due to other mental disorder: Secondary | ICD-10-CM

## 2021-02-15 NOTE — Telephone Encounter (Signed)
Controlled substance 

## 2021-02-15 NOTE — Telephone Encounter (Signed)
Call to RS

## 2021-03-12 ENCOUNTER — Other Ambulatory Visit: Payer: Self-pay | Admitting: Psychiatry

## 2021-03-12 DIAGNOSIS — M6289 Other specified disorders of muscle: Secondary | ICD-10-CM

## 2021-03-12 DIAGNOSIS — F4001 Agoraphobia with panic disorder: Secondary | ICD-10-CM

## 2021-03-14 NOTE — Telephone Encounter (Signed)
Last filled 01/31/21 appt on 05/08/21

## 2021-03-23 ENCOUNTER — Other Ambulatory Visit: Payer: Self-pay | Admitting: Psychiatry

## 2021-03-23 DIAGNOSIS — F5105 Insomnia due to other mental disorder: Secondary | ICD-10-CM

## 2021-03-27 NOTE — Telephone Encounter (Signed)
Last filled 02/15/21 appt 05/08/21

## 2021-04-19 ENCOUNTER — Other Ambulatory Visit: Payer: Self-pay | Admitting: Sports Medicine

## 2021-04-19 ENCOUNTER — Ambulatory Visit
Admission: RE | Admit: 2021-04-19 | Discharge: 2021-04-19 | Disposition: A | Discharge status: Home / Self Care | Payer: 59 | Source: Ambulatory Visit | Attending: Sports Medicine | Admitting: Sports Medicine

## 2021-04-19 DIAGNOSIS — M25512 Pain in left shoulder: Secondary | ICD-10-CM

## 2021-05-08 ENCOUNTER — Other Ambulatory Visit: Payer: Self-pay

## 2021-05-08 ENCOUNTER — Ambulatory Visit: Payer: 59 | Admitting: Psychiatry

## 2021-05-08 ENCOUNTER — Encounter: Payer: Self-pay | Admitting: Psychiatry

## 2021-05-08 DIAGNOSIS — F5105 Insomnia due to other mental disorder: Secondary | ICD-10-CM

## 2021-05-08 DIAGNOSIS — F4001 Agoraphobia with panic disorder: Secondary | ICD-10-CM | POA: Diagnosis not present

## 2021-05-08 DIAGNOSIS — M6289 Other specified disorders of muscle: Secondary | ICD-10-CM

## 2021-05-08 DIAGNOSIS — F330 Major depressive disorder, recurrent, mild: Secondary | ICD-10-CM

## 2021-05-08 DIAGNOSIS — F428 Other obsessive-compulsive disorder: Secondary | ICD-10-CM | POA: Diagnosis not present

## 2021-05-08 MED ORDER — DIAZEPAM 5 MG PO TABS: 5.0000 mg | ORAL_TABLET | Freq: Three times a day (TID) | ORAL | 5 refills | Status: DC | PRN

## 2021-05-08 MED ORDER — ZOLPIDEM TARTRATE 10 MG PO TABS: 10.0000 mg | ORAL_TABLET | Freq: Every evening | ORAL | 5 refills | Status: DC | PRN

## 2021-05-08 MED ORDER — FLUOXETINE HCL 20 MG PO CAPS
60.0000 mg | ORAL_CAPSULE | Freq: Every day | ORAL | 1 refills | Status: DC
Start: 2021-05-08 — End: 2021-10-08

## 2021-05-08 NOTE — Progress Notes (Signed)
Brandon White [id number] 12/06/1990 30 y.o.   Subjective:   Patient ID:  Brandon White is a 30 y.o. (DOB 1991-10-04) male.  Chief Complaint:  Chief Complaint  Patient presents with   Follow-up   Other obsessive-compulsive disorders   Anxiety    HPI [name] presents to the office today for follow-up of anxiety.  First seen 10/26/20 and started fluoxetine..  12/19/20 appt noted: Up to 30 mg fluoxetine about a month. Over all is better.  No longer feels weird on the SSRI and no SE.  Definitely better mood.  Still some intrusive thoughts.  Less irritable and depressed. Taking Ambien regularly and it helps.  But takes longer to fall asleep.  Average 7 hours. Takes lorazepam 0.5 mg daily and it helps.   Had panic but not now.  Plan:  DC lorazepam.   Asks about Valium for pelvic floor tension.  Ok trial  Up to 5 mg TID prn pain.   ncrease fluoxetine to 40 mg for 2 weeks, then in crease to 60 mg daily.  05/08/2021 appointment with the following noted: Doing OK.  Prozac and summer helpful.  Much improved moved and less irritable.  Less OCD and more energetic.  Less intrusive and more manageable intrusive thoughts.  Sometimes new trigger. Oldest D is starting puberty.  Brandon White 30 yo.   No sig SE with increase fluoxetine. Still needs Ambien to sleep.  Dissolves it under the tongue.  Working on sleep habits. No caffeine.   No sig depression generally.  Still som anxiety and avoids calling to make appts and talking to Brandon White's mother bc of arguments. [name] will have to do things he avoids.  No work avoidance. Last 2 weeks vivid dreams that were disturbing and made him feel unrested.  Past Psychiatric Medication Trials:  sertraline 300, fluoxetine Lorazepam, diazepam Ambien, trazodone hangover  Review of Systems:  Review of Systems  Cardiovascular:  Negative for chest pain and palpitations.  Genitourinary:  Positive for difficulty urinating and frequency.  Neurological:   Negative for tremors and weakness.  Psychiatric/Behavioral:  Positive for sleep disturbance.  Uro workup normal Done PT pelvic floor exercises  Medications: I have reviewed the patient's current medications.  Current Outpatient Medications  Medication Sig Dispense Refill   tamsulosin (FLOMAX) 0.4 MG CAPS capsule Take 0.4 mg by mouth daily.     tolterodine (DETROL LA) 4 MG 24 hr capsule Take 4 mg by mouth daily.     diazepam (VALIUM) 5 MG tablet Take 1 tablet (5 mg total) by mouth every 8 (eight) hours as needed. for anxiety 60 tablet 5   doxycycline (VIBRAMYCIN) 100 MG capsule Take 100 mg by mouth 2 (two) times daily. (Patient not taking: Reported on 05/08/2021)     FLUoxetine (PROZAC) 20 MG capsule Take 3 capsules (60 mg total) by mouth daily. 270 capsule 1   HYDROcodone-acetaminophen (NORCO/VICODIN) 5-325 MG tablet Take 1 tablet by mouth every 4 (four) hours as needed. (Patient not taking: Reported on 05/08/2021) 6 tablet 0   Meth-Hyo-M Bl-Na Phos-Ph Sal (URO-MP) 118 MG CAPS Take by mouth. (Patient not taking: Reported on 05/08/2021)     sulfamethoxazole-trimethoprim (BACTRIM DS) 800-160 MG tablet Take 1 tablet by mouth 2 (two) times daily. (Patient not taking: Reported on 05/08/2021)     zolpidem (AMBIEN) 10 MG tablet Take 1 tablet (10 mg total) by mouth at bedtime as needed for sleep. 30 tablet 5   No current facility-administered medications for this visit.  Medication Side Effects: None  Allergies: No Known Allergies  Past Medical History:  Diagnosis Date   Bladder spasm    Irritable bowel syndrome     Family History  Problem Relation Age of Onset   Anxiety disorder Mother    Depression Mother    Anxiety disorder Father    Depression Father    Anxiety disorder Sister     Social History   Socioeconomic History   Marital status: Married    Spouse name: Not on file   Number of children: Not on file   Years of education: Not on file   Highest education level: Not on  file  Occupational History   Not on file  Tobacco Use   Smoking status: Never   Smokeless tobacco: Never  Substance and Sexual Activity   Alcohol use: Not Currently   Drug use: Not Currently   Sexual activity: Not on file  Other Topics Concern   Not on file  Social History Narrative   Not on file   Social Determinants of Health   Financial Resource Strain: Not on file  Food Insecurity: Not on file  Transportation Needs: Not on file  Physical Activity: Not on file  Stress: Not on file  Social Connections: Not on file  Intimate Partner Violence: Not on file    Past Medical History, Surgical history, Social history, and Family history were reviewed and updated as appropriate.   Please see review of systems for further details on the patient's review from today.   Objective:   Physical Exam:  There were no vitals taken for this visit.  Physical Exam Constitutional:      General: He is not in acute distress. Musculoskeletal:        General: No deformity.  Neurological:     Mental Status: He is alert and oriented to person, place, and time.     Coordination: Coordination normal.  Psychiatric:        Attention and Perception: Attention and perception normal. He does not perceive auditory or visual hallucinations.        Mood and Affect: Mood is anxious. Mood is not depressed. Affect is not labile, blunt, angry or inappropriate.        Speech: Speech normal.        Behavior: Behavior normal.        Thought Content: Thought content normal. Thought content is not paranoid or delusional. Thought content does not include homicidal or suicidal ideation. Thought content does not include homicidal or suicidal plan.        Cognition and Memory: Cognition and memory normal.        Judgment: Judgment normal.     Comments: Insight intact Still intrusive thoughts but much better. Minimal depression and was quite depressed.    Lab Review:     Component Value Date/Time   NA 134  (L) 10/02/2008 0149   K 4.2 10/02/2008 0149   CL 101 10/02/2008 0149   CO2 28 10/02/2008 0149   GLUCOSE 103 (H) 10/02/2008 0149   BUN 8 10/02/2008 0149   CREATININE 0.86 10/02/2008 0149   CALCIUM 8.9 10/02/2008 0149   PROT 7.9 10/06/2008 2026   ALBUMIN 4.1 10/06/2008 2026   AST 73 (H) 10/06/2008 2026   ALT 127 (H) 10/06/2008 2026   ALKPHOS 624 (H) 10/06/2008 2026   BILITOT 2.6 (H) 10/06/2008 2026   GFRNONAA NOT CALCULATED 10/02/2008 0149   GFRAA  10/02/2008 0149    NOT CALCULATED  The eGFR has been calculated using the MDRD equation. This calculation has not been validated in all clinical       Component Value Date/Time   WBC 15.8 (H) 10/02/2008 0149   RBC 4.27 10/02/2008 0149   HGB 12.4 10/02/2008 0149   HCT 35.7 (L) 10/02/2008 0149   PLT 389 10/02/2008 0149   MCV 83.6 10/02/2008 0149   MCHC 34.7 10/02/2008 0149   RDW 15.9 (H) 10/02/2008 0149   LYMPHSABS 9.0 (H) 10/02/2008 0149   MONOABS 1.7 (H) 10/02/2008 0149   EOSABS 0.0 10/02/2008 0149   BASOSABS 0.0 10/02/2008 0149    No results found for: POCLITH, LITHIUM   No results found for: PHENYTOIN, PHENOBARB, VALPROATE, CBMZ   .res Assessment: Plan:    Lavell was seen today for follow-up, other obsessive-compulsive disorders and anxiety.  Diagnoses and all orders for this visit:  Other obsessive-compulsive disorders -     FLUoxetine (PROZAC) 20 MG capsule; Take 3 capsules (60 mg total) by mouth daily.  Depression, major, recurrent, mild (HCC) -     FLUoxetine (PROZAC) 20 MG capsule; Take 3 capsules (60 mg total) by mouth daily.  Panic disorder with agoraphobia -     diazepam (VALIUM) 5 MG tablet; Take 1 tablet (5 mg total) by mouth every 8 (eight) hours as needed. for anxiety  Insomnia due to mental condition -     Discontinue: zolpidem (AMBIEN) 10 MG tablet; Take 1 tablet (10 mg total) by mouth at bedtime as needed for sleep. -     zolpidem (AMBIEN) 10 MG tablet; Take 1 tablet (10 mg total) by  mouth at bedtime as needed for sleep.  Pelvic floor dysfunction -     diazepam (VALIUM) 5 MG tablet; Take 1 tablet (5 mg total) by mouth every 8 (eight) hours as needed. for anxiety    Greater than 50% of 30 face to face time with patient was spent on counseling and coordination of care. We discussed:  various dx. Continue Valium for pelvic floor tension.  Ok trial  Up to 5 mg TID prn pain.  It is helpful and still has some problems with it. Tolerated fine and mainly used at night. We discussed the short-term risks associated with benzodiazepines including sedation and increased fall risk among others.  Discussed long-term side effect risk including dependence, potential withdrawal symptoms, and the potential eventual dose-related risk of dementia.  But recent studies from 2020 dispute this association between benzodiazepines and dementia risk. Newer studies in 2020 do not support an association with dementia.  Disc behavioral therapy for avoidance.    Continue fluoxetine 60 mg daily.  BC got benefit and tolerated. Disc SE.  He agrees.  60 mg is more typical dose in OCD and faster we get there the faster he'll improve.  Continue Ambien for sleep.  Disc tolerance and withdrawal.  Answered questions about gabapentin Rx.  FU 4-6 mos.  Call prn.  Lynder Parents, MD, DFAPA      Please see After Visit Summary for patient specific instructions.  No future appointments.  No orders of the defined types were placed in this encounter.   -------------------------------

## 2021-07-16 ENCOUNTER — Other Ambulatory Visit: Payer: Self-pay | Admitting: Psychiatry

## 2021-07-16 DIAGNOSIS — F5105 Insomnia due to other mental disorder: Secondary | ICD-10-CM

## 2021-10-08 ENCOUNTER — Encounter: Payer: Self-pay | Admitting: Psychiatry

## 2021-10-08 ENCOUNTER — Other Ambulatory Visit: Payer: Self-pay

## 2021-10-08 ENCOUNTER — Ambulatory Visit: Payer: 59 | Admitting: Psychiatry

## 2021-10-08 DIAGNOSIS — F5105 Insomnia due to other mental disorder: Secondary | ICD-10-CM

## 2021-10-08 DIAGNOSIS — M6289 Other specified disorders of muscle: Secondary | ICD-10-CM | POA: Diagnosis not present

## 2021-10-08 DIAGNOSIS — F428 Other obsessive-compulsive disorder: Secondary | ICD-10-CM

## 2021-10-08 DIAGNOSIS — F4001 Agoraphobia with panic disorder: Secondary | ICD-10-CM

## 2021-10-08 DIAGNOSIS — F330 Major depressive disorder, recurrent, mild: Secondary | ICD-10-CM

## 2021-10-08 MED ORDER — FLUOXETINE HCL 20 MG PO CAPS: 60.0000 mg | ORAL_CAPSULE | Freq: Every day | ORAL | 0 refills | Status: DC

## 2021-10-08 MED ORDER — ARIPIPRAZOLE 5 MG PO TABS: 5.0000 mg | ORAL_TABLET | Freq: Every day | ORAL | 0 refills | Status: DC

## 2021-10-08 MED ORDER — ZOLPIDEM TARTRATE 10 MG PO TABS: 10.0000 mg | ORAL_TABLET | Freq: Every evening | ORAL | 2 refills | Status: DC | PRN

## 2021-10-08 MED ORDER — DIAZEPAM 5 MG PO TABS: 5.0000 mg | ORAL_TABLET | Freq: Three times a day (TID) | ORAL | 5 refills | Status: DC | PRN

## 2021-10-08 NOTE — Patient Instructions (Signed)
Add Abilify 1/2 tablet for 1 week, then 1 tablet daily if no improvement. After 3 weeks if no benefit stop it.

## 2021-10-08 NOTE — Progress Notes (Signed)
Brandon White [id number] 1991/02/06 30 y.o.   Subjective:   Patient ID:  Brandon White is a 30 y.o. (DOB 1990/11/26) male.  Chief Complaint:  Chief Complaint  Patient presents with   Follow-up    Other obsessive-compulsive disorders   Anxiety   Depression   Sleeping Problem     HPI Brandon White presents to the office today for follow-up of anxiety.  First seen 10/26/20 and started fluoxetine..  12/19/20 appt noted: Up to 30 mg fluoxetine about a month. Over all is better.  No longer feels weird on the SSRI and no SE.  Definitely better mood.  Still some intrusive thoughts.  Less irritable and depressed. Taking Ambien regularly and it helps.  But takes longer to fall asleep.  Average 7 hours. Takes lorazepam 0.5 mg daily and it helps.   Had panic but not now.  Plan:  DC lorazepam.   Asks about Valium for pelvic floor tension.  Ok trial  Up to 5 mg TID prn pain.   ncrease fluoxetine to 40 mg for 2 weeks, then in crease to 60 mg daily.  05/08/2021 appointment with the following noted: Doing OK.  Prozac and summer helpful.  Much improved moved and less irritable.  Less OCD and more energetic.  Less intrusive and more manageable intrusive thoughts.  Sometimes new trigger. Oldest D is starting puberty.  Brandon White 30 yo.   No sig SE with increase fluoxetine. Still needs Ambien to sleep.  Dissolves it under the tongue.  Working on sleep habits. No caffeine.   No sig depression generally.  Still som anxiety and avoids calling to make appts and talking to Brandon White's mother bc of arguments. Brandon White will have to do things he avoids.  No work avoidance. Last 2 weeks vivid dreams that were disturbing and made him feel unrested. Plan: Continue Valium for pelvic floor tension.  Ok trial  Up to 5 mg TID prn pain.  It is helpful and still has some problems with it. Continue fluoxetine 60 mg daily.  BC got benefit and tolerated.  10/08/2021 appointment with the following noted: Usually  diazepam 5 just once before bed and acc daytime.  Still dealing with pelvic floor tension and working with PT.  Diazepam helped some initially. Sleep is usually ok and Ambien may seem to not work.  Overall sleeping OK.  Usually trouble going to sleep.  Occ night sweats.  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  Anxiety has been OK and definitely markedly better than last year.  Still struggle with some avoidance in calling peoople DT anxiety. Has skipped events over social anxiety.. Much better depression over the last year.  Need to be more consistent with occ bad days with no energy, flatness, irritability upon awakening about once or twice a week.  Getting outside helps and winter is harder. Can struggle with motivation and preparing for interviews. No SE with meds. Function is OK but season change is hard.   Past Psychiatric Medication Trials:  sertraline 300, fluoxetine 60 Lorazepam, diazepam Ambien, trazodone hangover  Review of Systems:  Review of Systems  Cardiovascular:  Negative for chest pain and palpitations.  Genitourinary:  Positive for difficulty urinating and frequency.       Pelvic pain  Neurological:  Negative for tremors and weakness.  Psychiatric/Behavioral:  Positive for sleep disturbance.  Uro workup normal Done PT pelvic floor exercises  Medications: I have reviewed the patient's current medications.  Current Outpatient Medications  Medication Sig  Dispense Refill   ARIPiprazole (ABILIFY) 5 MG tablet Take 1 tablet (5 mg total) by mouth daily. 90 tablet 0   diazepam (VALIUM) 5 MG tablet Take 1 tablet (5 mg total) by mouth every 8 (eight) hours as needed. for anxiety 60 tablet 5   FLUoxetine (PROZAC) 20 MG capsule Take 3 capsules (60 mg total) by mouth daily. 270 capsule 0   gabapentin (NEURONTIN) 100 MG capsule Take 200 mg by mouth 2 (two) times daily.     tamsulosin (FLOMAX) 0.4 MG CAPS capsule Take 0.4 mg by mouth daily. (Patient not taking: Reported on  10/08/2021)     zolpidem (AMBIEN) 10 MG tablet Take 1 tablet (10 mg total) by mouth at bedtime as needed. for sleep 30 tablet 2   No current facility-administered medications for this visit.    Medication Side Effects: None  Allergies: No Known Allergies  Past Medical History:  Diagnosis Date   Bladder spasm    Irritable bowel syndrome     Family History  Problem Relation Age of Onset   Anxiety disorder Mother    Depression Mother    Anxiety disorder Father    Depression Father    Anxiety disorder Sister     Social History   Socioeconomic History   Marital status: Married    Spouse name: Not on file   Number of children: Not on file   Years of education: Not on file   Highest education level: Not on file  Occupational History   Not on file  Tobacco Use   Smoking status: Never   Smokeless tobacco: Never  Substance and Sexual Activity   Alcohol use: Not Currently   Drug use: Not Currently   Sexual activity: Not on file  Other Topics Concern   Not on file  Social History Narrative   Not on file   Social Determinants of Health   Financial Resource Strain: Not on file  Food Insecurity: Not on file  Transportation Needs: Not on file  Physical Activity: Not on file  Stress: Not on file  Social Connections: Not on file  Intimate Partner Violence: Not on file    Past Medical History, Surgical history, Social history, and Family history were reviewed and updated as appropriate.   Please see review of systems for further details on the patient's review from today.   Objective:   Physical Exam:  There were no vitals taken for this visit.  Physical Exam Constitutional:      General: He is not in acute distress. Musculoskeletal:        General: No deformity.  Neurological:     Mental Status: He is alert and oriented to person, place, and time.     Coordination: Coordination normal.  Psychiatric:        Attention and Perception: Attention and perception  normal. He does not perceive auditory or visual hallucinations.        Mood and Affect: Mood is anxious. Mood is not depressed. Affect is not labile, blunt, angry or inappropriate.        Speech: Speech normal.        Behavior: Behavior normal.        Thought Content: Thought content normal. Thought content is not paranoid or delusional. Thought content does not include homicidal or suicidal ideation.        Cognition and Memory: Cognition and memory normal.        Judgment: Judgment normal.     Comments: Insight intact  Still intrusive thoughts but much better. Still residual anxiety.   Minimal depression and was quite depressed.    Lab Review:     Component Value Date/Time   NA 134 (L) 10/02/2008 0149   K 4.2 10/02/2008 0149   CL 101 10/02/2008 0149   CO2 28 10/02/2008 0149   GLUCOSE 103 (H) 10/02/2008 0149   BUN 8 10/02/2008 0149   CREATININE 0.86 10/02/2008 0149   CALCIUM 8.9 10/02/2008 0149   PROT 7.9 10/06/2008 2026   ALBUMIN 4.1 10/06/2008 2026   AST 73 (H) 10/06/2008 2026   ALT 127 (H) 10/06/2008 2026   ALKPHOS 624 (H) 10/06/2008 2026   BILITOT 2.6 (H) 10/06/2008 2026   GFRNONAA NOT CALCULATED 10/02/2008 0149   GFRAA  10/02/2008 0149    NOT CALCULATED        The eGFR has been calculated using the MDRD equation. This calculation has not been validated in all clinical       Component Value Date/Time   WBC 15.8 (H) 10/02/2008 0149   RBC 4.27 10/02/2008 0149   HGB 12.4 10/02/2008 0149   HCT 35.7 (L) 10/02/2008 0149   PLT 389 10/02/2008 0149   MCV 83.6 10/02/2008 0149   MCHC 34.7 10/02/2008 0149   RDW 15.9 (H) 10/02/2008 0149   LYMPHSABS 9.0 (H) 10/02/2008 0149   MONOABS 1.7 (H) 10/02/2008 0149   EOSABS 0.0 10/02/2008 0149   BASOSABS 0.0 10/02/2008 0149    No results found for: POCLITH, LITHIUM   No results found for: PHENYTOIN, PHENOBARB, VALPROATE, CBMZ   .res Assessment: Plan:    Zephan was seen today for follow-up, anxiety, depression and sleeping  problem.  Diagnoses and all orders for this visit:  Depression, major, recurrent, mild (HCC) -     FLUoxetine (PROZAC) 20 MG capsule; Take 3 capsules (60 mg total) by mouth daily. -     ARIPiprazole (ABILIFY) 5 MG tablet; Take 1 tablet (5 mg total) by mouth daily.  Panic disorder with agoraphobia -     diazepam (VALIUM) 5 MG tablet; Take 1 tablet (5 mg total) by mouth every 8 (eight) hours as needed. for anxiety  Pelvic floor dysfunction -     diazepam (VALIUM) 5 MG tablet; Take 1 tablet (5 mg total) by mouth every 8 (eight) hours as needed. for anxiety  Insomnia due to mental condition -     zolpidem (AMBIEN) 10 MG tablet; Take 1 tablet (10 mg total) by mouth at bedtime as needed. for sleep  Other obsessive-compulsive disorders -     FLUoxetine (PROZAC) 20 MG capsule; Take 3 capsules (60 mg total) by mouth daily.    Greater than 50% of 30 face to face time with patient was spent on counseling and coordination of care. We discussed:  various dx. Continue Valium for pelvic floor tension.  Ok  Up to 5 mg TID prn pain.  It is helpful and still has some problems with it. Tolerated fine and mainly used at night. We discussed the short-term risks associated with benzodiazepines including sedation and increased fall risk among others.  Discussed long-term side effect risk including dependence, potential withdrawal symptoms, and the potential eventual dose-related risk of dementia.  But recent studies from 2020 dispute this association between benzodiazepines and dementia risk. Newer studies in 2020 do not support an association with dementia.  Disc behavioral therapy for avoidance.   Consider Abilify augmentation for motivation, well-being, anxiety Add Abilify 1/2 tablet for 1 week, then 1 tablet daily  if no improvement. After 3 weeks if no benefit stop it.  Encourage exercise and getting outside if needed.  Continue fluoxetine 60 mg daily.  BC got benefit and tolerated. Disc SE.  He  agrees.  60 mg is more typical dose in OCD and faster we get there the faster he'll improve.  Continue Ambien for sleep.  Disc tolerance and withdrawal.  Answered questions about gabapentin Rx.  FU 3 mos.  Call prn.  Lynder Parents, MD, DFAPA      Please see After Visit Summary for patient specific instructions.  No future appointments.   No orders of the defined types were placed in this encounter.   -------------------------------

## 2021-11-14 ENCOUNTER — Other Ambulatory Visit: Payer: Self-pay | Admitting: Psychiatry

## 2021-11-14 DIAGNOSIS — F330 Major depressive disorder, recurrent, mild: Secondary | ICD-10-CM

## 2021-11-14 DIAGNOSIS — F428 Other obsessive-compulsive disorder: Secondary | ICD-10-CM

## 2022-01-08 ENCOUNTER — Ambulatory Visit: Payer: 59 | Admitting: Psychiatry

## 2022-01-08 ENCOUNTER — Other Ambulatory Visit: Payer: Self-pay

## 2022-01-08 ENCOUNTER — Encounter: Payer: Self-pay | Admitting: Psychiatry

## 2022-01-08 DIAGNOSIS — F4001 Agoraphobia with panic disorder: Secondary | ICD-10-CM

## 2022-01-08 DIAGNOSIS — F428 Other obsessive-compulsive disorder: Secondary | ICD-10-CM | POA: Diagnosis not present

## 2022-01-08 DIAGNOSIS — F5105 Insomnia due to other mental disorder: Secondary | ICD-10-CM | POA: Diagnosis not present

## 2022-01-08 DIAGNOSIS — F330 Major depressive disorder, recurrent, mild: Secondary | ICD-10-CM

## 2022-01-08 DIAGNOSIS — M6289 Other specified disorders of muscle: Secondary | ICD-10-CM

## 2022-01-08 MED ORDER — BUSPIRONE HCL 15 MG PO TABS: ORAL_TABLET | ORAL | 0 refills | Status: DC
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
## 2022-01-08 NOTE — Patient Instructions (Signed)
Start buspirone 1/3 tablet twice daily for 1 week, then 2/3 tablet twice daily for 1 week then 1 twice daily

## 2022-01-30 ENCOUNTER — Other Ambulatory Visit: Payer: Self-pay | Admitting: Psychiatry

## 2022-01-30 DIAGNOSIS — F5105 Insomnia due to other mental disorder: Secondary | ICD-10-CM

## 2022-03-03 ENCOUNTER — Other Ambulatory Visit: Payer: Self-pay | Admitting: Psychiatry

## 2022-03-03 DIAGNOSIS — F330 Major depressive disorder, recurrent, mild: Secondary | ICD-10-CM

## 2022-03-03 DIAGNOSIS — F428 Other obsessive-compulsive disorder: Secondary | ICD-10-CM

## 2022-03-26 ENCOUNTER — Encounter: Payer: Self-pay | Admitting: Psychiatry

## 2022-03-26 ENCOUNTER — Ambulatory Visit: Payer: 59 | Admitting: Psychiatry

## 2022-03-26 DIAGNOSIS — F4001 Agoraphobia with panic disorder: Secondary | ICD-10-CM | POA: Diagnosis not present

## 2022-03-26 DIAGNOSIS — F428 Other obsessive-compulsive disorder: Secondary | ICD-10-CM | POA: Diagnosis not present

## 2022-03-26 DIAGNOSIS — M6289 Other specified disorders of muscle: Secondary | ICD-10-CM

## 2022-03-26 DIAGNOSIS — F5105 Insomnia due to other mental disorder: Secondary | ICD-10-CM

## 2022-03-26 DIAGNOSIS — F330 Major depressive disorder, recurrent, mild: Secondary | ICD-10-CM

## 2022-03-26 MED ORDER — BUSPIRONE HCL 15 MG PO TABS: ORAL_TABLET | ORAL | 0 refills | Status: DC

## 2022-03-26 MED ORDER — ZOLPIDEM TARTRATE 10 MG PO TABS: 10.0000 mg | ORAL_TABLET | Freq: Every evening | ORAL | 2 refills | Status: DC | PRN

## 2022-03-26 MED ORDER — FLUOXETINE HCL 20 MG PO CAPS
60.0000 mg | ORAL_CAPSULE | Freq: Every day | ORAL | 1 refills | Status: DC
Start: 2022-03-26 — End: 2023-01-09

## 2022-03-26 MED ORDER — DIAZEPAM 5 MG PO TABS: 5.0000 mg | ORAL_TABLET | Freq: Three times a day (TID) | ORAL | 5 refills | Status: DC | PRN
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
## 2022-07-29 ENCOUNTER — Ambulatory Visit: Payer: 59 | Admitting: Psychiatry

## 2022-08-28 IMAGING — CR DG SHOULDER 2+V*L*
3 series · 3 of 3 positions shown · non-contrast
Comparison: 09/26/2008 chest

CLINICAL DATA: Fall, shoulder pain

EXAM:
LEFT SHOULDER - 2+ VIEW

[w shoulder grashey left]
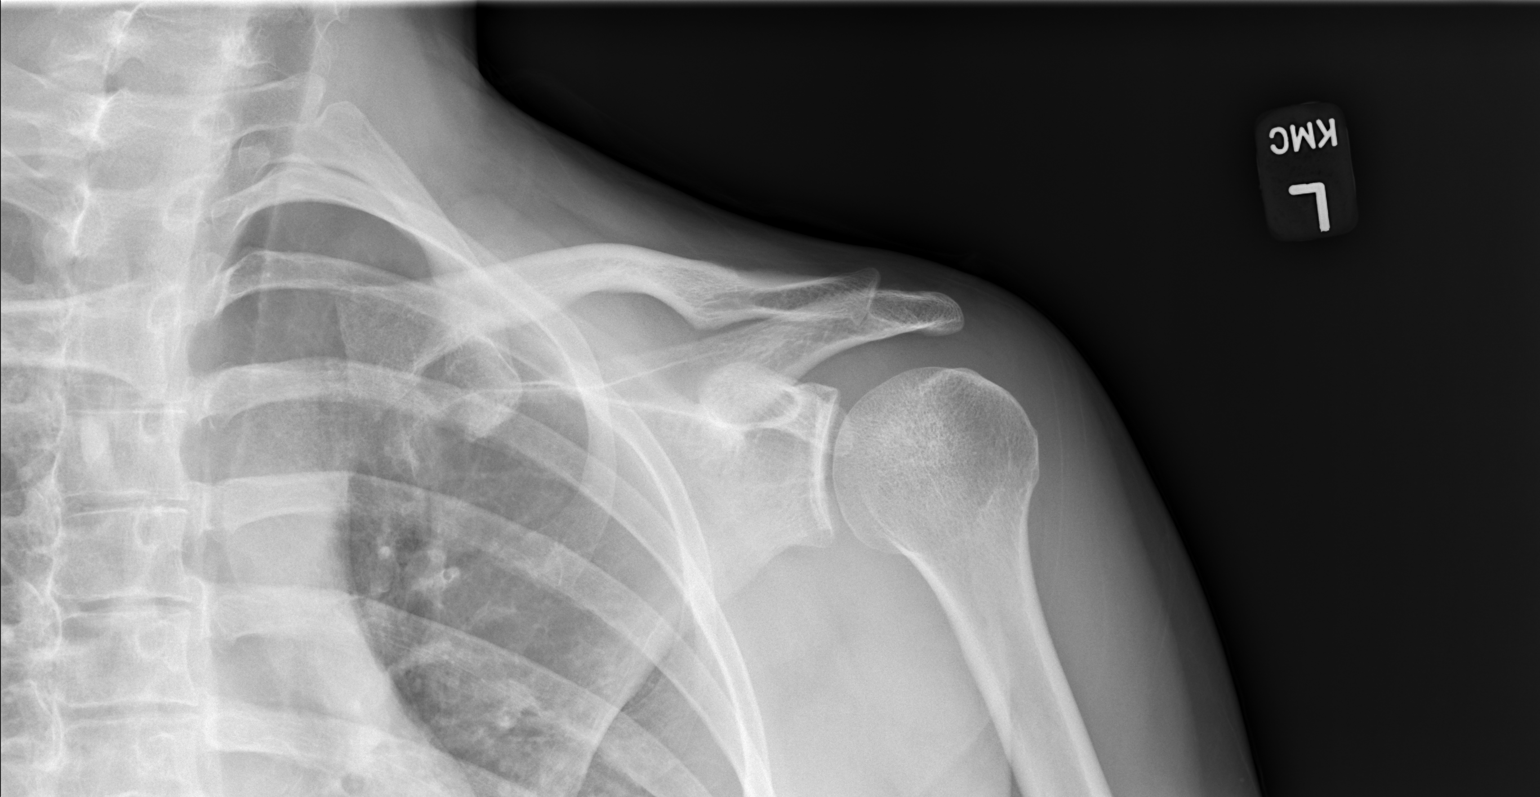

[w shoulder y-view left]
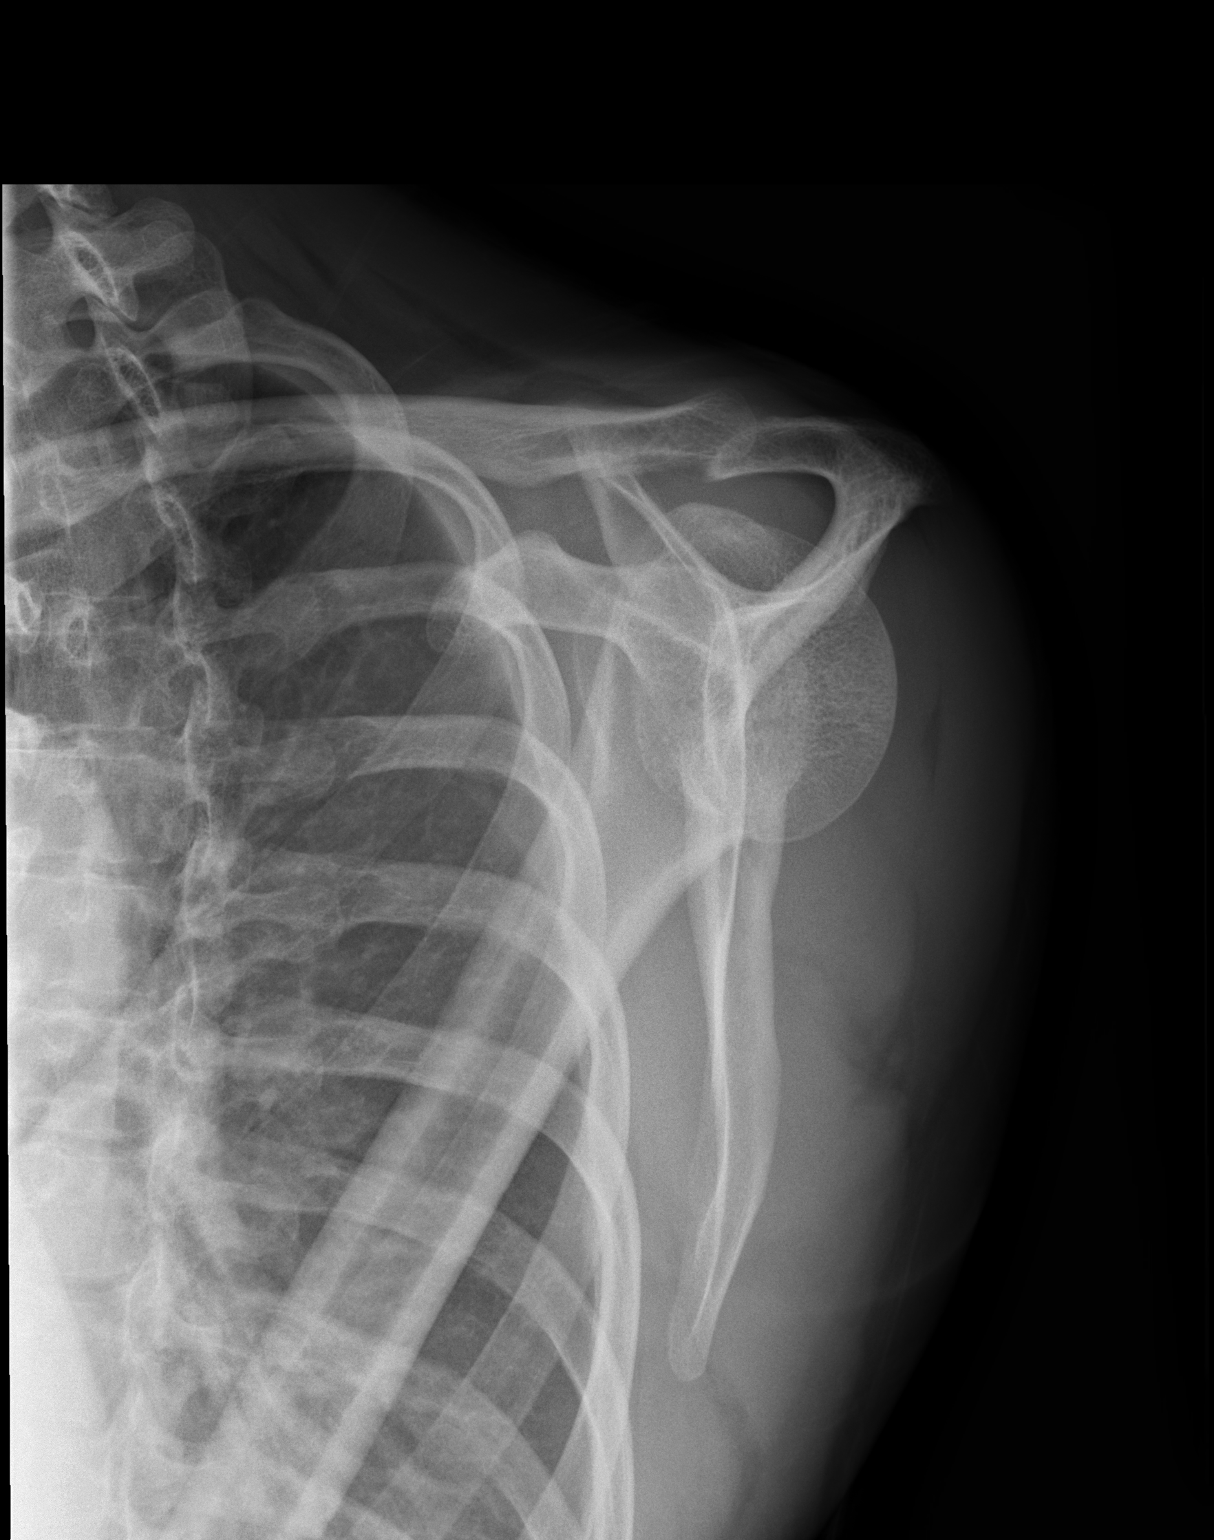

[x shoulder axillary left]
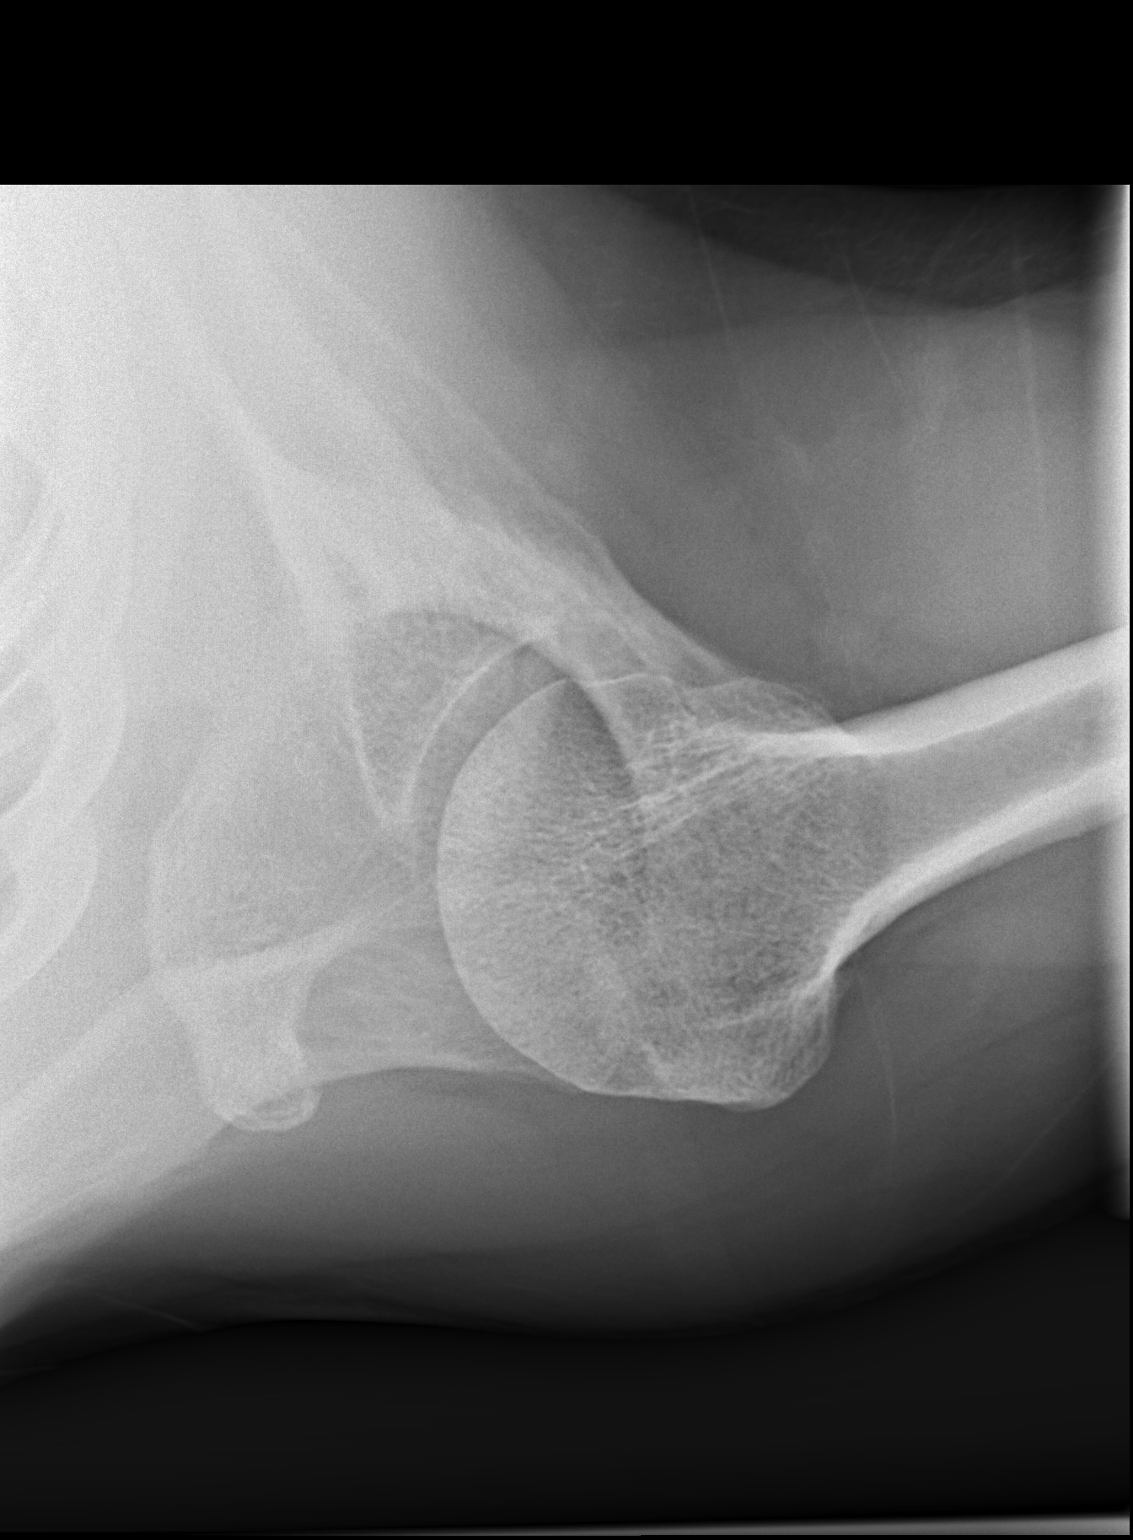

[3 of 3 positions shown; findings below may reference images not displayed]

FINDINGS: There is no evidence of fracture or dislocation. There is no
evidence of arthropathy or other focal bone abnormality. Soft
tissues are unremarkable.
IMPRESSION: No acute finding by plain radiography

## 2022-11-02 ENCOUNTER — Other Ambulatory Visit: Payer: Self-pay | Admitting: Psychiatry

## 2022-11-02 DIAGNOSIS — M6289 Other specified disorders of muscle: Secondary | ICD-10-CM

## 2022-11-02 DIAGNOSIS — F4001 Agoraphobia with panic disorder: Secondary | ICD-10-CM

## 2022-11-02 DIAGNOSIS — F5105 Insomnia due to other mental disorder: Secondary | ICD-10-CM

## 2022-11-04 NOTE — Telephone Encounter (Signed)
Filled 10/3

## 2022-11-04 NOTE — Telephone Encounter (Signed)
Please schedule appt

## 2022-11-04 NOTE — Telephone Encounter (Signed)
Pt is scheduled 01/09/23

## 2022-12-05 ENCOUNTER — Other Ambulatory Visit: Payer: Self-pay | Admitting: Psychiatry

## 2022-12-05 DIAGNOSIS — F4001 Agoraphobia with panic disorder: Secondary | ICD-10-CM

## 2022-12-05 DIAGNOSIS — M6289 Other specified disorders of muscle: Secondary | ICD-10-CM

## 2023-01-09 ENCOUNTER — Ambulatory Visit (INDEPENDENT_AMBULATORY_CARE_PROVIDER_SITE_OTHER): Payer: 59 | Admitting: Psychiatry

## 2023-01-09 ENCOUNTER — Encounter: Payer: Self-pay | Admitting: Psychiatry

## 2023-01-09 DIAGNOSIS — M6289 Other specified disorders of muscle: Secondary | ICD-10-CM | POA: Diagnosis not present

## 2023-01-09 DIAGNOSIS — F330 Major depressive disorder, recurrent, mild: Secondary | ICD-10-CM | POA: Diagnosis not present

## 2023-01-09 DIAGNOSIS — F428 Other obsessive-compulsive disorder: Secondary | ICD-10-CM | POA: Diagnosis not present

## 2023-01-09 DIAGNOSIS — F4001 Agoraphobia with panic disorder: Secondary | ICD-10-CM | POA: Diagnosis not present

## 2023-01-09 DIAGNOSIS — F5105 Insomnia due to other mental disorder: Secondary | ICD-10-CM

## 2023-01-09 MED ORDER — DIAZEPAM 5 MG PO TABS
5.0000 mg | ORAL_TABLET | Freq: Three times a day (TID) | ORAL | 4 refills | Status: AC | PRN
Start: 2023-01-09 — End: ?

## 2023-01-09 MED ORDER — ZOLPIDEM TARTRATE 10 MG PO TABS: 10.0000 mg | ORAL_TABLET | Freq: Every evening | ORAL | 5 refills | Status: DC | PRN

## 2023-01-09 MED ORDER — FLUOXETINE HCL 20 MG PO CAPS: 60.0000 mg | ORAL_CAPSULE | Freq: Every day | ORAL | 1 refills | Status: DC
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
## 2023-05-01 ENCOUNTER — Ambulatory Visit (INDEPENDENT_AMBULATORY_CARE_PROVIDER_SITE_OTHER): Payer: 59 | Admitting: Mental Health

## 2023-05-01 DIAGNOSIS — F330 Major depressive disorder, recurrent, mild: Secondary | ICD-10-CM
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
## 2023-05-12 ENCOUNTER — Telehealth: Payer: Self-pay | Admitting: Psychiatry

## 2023-05-12 DIAGNOSIS — F428 Other obsessive-compulsive disorder: Secondary | ICD-10-CM

## 2023-05-12 DIAGNOSIS — F330 Major depressive disorder, recurrent, mild: Secondary | ICD-10-CM

## 2023-05-12 NOTE — Telephone Encounter (Signed)
PT lvm that he would like to increase his prozac from 60 mg to 80 mg. He wants to know if Cottle agrees and how to do it. Please call him at [phone]

## 2023-05-12 NOTE — Telephone Encounter (Signed)
Patient asking if he could increase Prozac to 80 mg. There has been marital issues the last few months that is ongoing, possible separation. He feels like his OCD and intrusive thoughts have increased. He reports that it had been discussed previously increasing the dose. He is also asking if increased can he take AM and PM to mitigate night sweats and heartburn.

## 2023-05-12 NOTE — Telephone Encounter (Signed)
Ok to increase to 80 mg daily and can split between am and pm.  Would be less likely to have heartburn if taken with meal.

## 2023-05-13 MED ORDER — FLUOXETINE HCL 40 MG PO CAPS: 80.0000 mg | ORAL_CAPSULE | Freq: Every day | ORAL | 0 refills | Status: DC

## 2023-05-13 NOTE — Telephone Encounter (Signed)
Sent MyChart message with info.  Sent new Rx.

## 2023-05-26 ENCOUNTER — Ambulatory Visit (INDEPENDENT_AMBULATORY_CARE_PROVIDER_SITE_OTHER): Payer: 59 | Admitting: Mental Health

## 2023-05-26 DIAGNOSIS — F428 Other obsessive-compulsive disorder: Secondary | ICD-10-CM | POA: Diagnosis not present
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
## 2023-06-04 ENCOUNTER — Ambulatory Visit: Payer: 59 | Admitting: Mental Health

## 2023-06-04 DIAGNOSIS — F330 Major depressive disorder, recurrent, mild: Secondary | ICD-10-CM

## 2023-06-04 DIAGNOSIS — F428 Other obsessive-compulsive disorder: Secondary | ICD-10-CM | POA: Diagnosis not present

## 2023-06-04 NOTE — Progress Notes (Addendum)
[facility] psychotherapy note Name: Brandon White Date: 06/04/2023 MRN: [id number] DOB: 10-06-1991 PCP: Tally Joe, MD  Time spent: 45 minutes  Treatment: Individual therapy   Mental Status Exam:    Appearance:    Casual     Behavior:   Appropriate  Motor:   WNL  Speech/Language:    Clear and Coherent  Affect:   Full range   Mood:   Sad, pleasant  Thought process:   Logical, linear, goal directed  Thought content:     WNL  Sensory/Perceptual disturbances:     none  Orientation:   x4  Attention:   Good  Concentration:   Good  Memory:   Intact  Fund of knowledge:    Consistent with age and development  Insight:     Good  Judgment:    Good  Impulse Control:   Good     Reported Symptoms: Anxiety, obsessive thinking, feelings of guilt  Risk Assessment: Danger to Self:  No Self-injurious Behavior: No Danger to Others: No Duty to Warn:no Physical Aggression / Violence:No  Access to Firearms a concern: No  Gang Involvement:No  Patient / guardian was educated about steps to take if suicide or homicide risk level increases between visits: yes While future psychiatric events cannot be accurately predicted, the patient does not currently require acute inpatient psychiatric care and does not currently meet [facility] involuntary commitment criteria.  Subjective: Patient arrived on time for today's session.  He shared progress where he stated that he and his wife continue to live together but at some point he sees the value of their separating physically.  He feels that complicates the process by continuing to live together, however, he also recognizes his short-term and is needed at this time.  He shared how he wants his wife to be more engaged in the process, there identifying household budget to ultimately allow them to navigate their future after the separation.  He stated they have met with a mediator recently.  He continues to feel their relationship has been  in decline for quite a while, and at this point he continues to maintain the value of their focusing primarily on their children and coparenting.  He stated they had engaged in a few couples therapy sessions which he feels have been helpful to work on their communication.  Facilitated his identifying needs, ways he tries to set interpersonal boundaries as well as outlets for stress management.  He continues to engage in exercise through boxing, tries to be mindful of thoughts that can be difficult about transitioning through this process of separation.    Interventions: Supportive therapy, motivational interviewing   Diagnoses:    ICD-10-CM   1. Depression, major, recurrent, mild (HCC)  F33.0     2. Other obsessive-compulsive disorders  F42.8          Plan: Patient is to use coping as identified in session, utilize his support system, continue to practice self-care and stress reduction.   Long-term goal: Improve management of distressful emotions including his anxiety.        Improve coping and identification and processing of feelings.  Short-term goal: To identify and process feelings related to recent stressors, primarily marital.                   Continue to work to be mindful of thoughts associated with increasing his anxiety and work to reframe them and use other coping skills as  identified in session.                                Assessment of progress:  progressing     Waldron Session, [facility]

## 2023-06-11 ENCOUNTER — Other Ambulatory Visit: Payer: Self-pay | Admitting: Psychiatry

## 2023-06-23 ENCOUNTER — Ambulatory Visit (INDEPENDENT_AMBULATORY_CARE_PROVIDER_SITE_OTHER): Payer: 59 | Admitting: Mental Health

## 2023-06-23 DIAGNOSIS — F330 Major depressive disorder, recurrent, mild: Secondary | ICD-10-CM | POA: Diagnosis not present

## 2023-06-23 NOTE — Progress Notes (Unsigned)
[facility] psychotherapy note Name: Brandon White Date: 06/24/2023 MRN: [id number] DOB: 1991-06-01 PCP: Tally Joe, MD  Time spent: 46 minutes  Treatment: Individual therapy   Mental Status Exam:    Appearance:    Casual     Behavior:   Appropriate  Motor:   WNL  Speech/Language:    Clear and Coherent  Affect:   Full range   Mood:   Sad, pleasant  Thought process:   Logical, linear, goal directed  Thought content:     WNL  Sensory/Perceptual disturbances:     none  Orientation:   x4  Attention:   Good  Concentration:   Good  Memory:   Intact  Fund of knowledge:    Consistent with age and development  Insight:     Good  Judgment:    Good  Impulse Control:   Good     Reported Symptoms: Anxiety, obsessive thinking, feelings of guilt  Risk Assessment: Danger to Self:  No Self-injurious Behavior: No Danger to Others: No Duty to Warn:no Physical Aggression / Violence:No  Access to Firearms a concern: No  Gang Involvement:No  Patient / guardian was educated about steps to take if suicide or homicide risk level increases between visits: yes While future psychiatric events cannot be accurately predicted, the patient does not currently require acute inpatient psychiatric care and does not currently meet [facility] involuntary commitment criteria.  Subjective: Patient arrived on time for today's session.  Patient shared recent events and progress.  He stated that he continues couples counseling with his wife however, at this point feels that he may only need a couple of sessions more due to their moving more firmly to the idea of divorcing.  He went on to share differences between him and his wife regarding various aspects of their relationship, 1 of which financial.  Through further guided discovery, he identified the need to be mindful of how to cope when he is around her which is typically for an hour or two per day as he is adjusting to their relationship is  changing and feeling more stressed due to financial expectations.    Interventions: Supportive therapy, motivational interviewing   Diagnoses:    ICD-10-CM   1. Depression, major, recurrent, mild (HCC)  F33.0           Plan: Patient is to use coping as identified in session, utilize his support system, continue to practice self-care and stress reduction.   Long-term goal: Improve management of distressful emotions including his anxiety.        Improve coping and identification and processing of feelings.  Short-term goal: To identify and process feelings related to recent stressors, primarily marital.                   Continue to work to be mindful of thoughts associated with increasing his anxiety and work to reframe them and use other coping skills as            identified in session.                                Assessment of progress:  progressing     Waldron Session, [facility]

## 2023-07-02 ENCOUNTER — Telehealth (INDEPENDENT_AMBULATORY_CARE_PROVIDER_SITE_OTHER): Payer: 59 | Admitting: Mental Health

## 2023-07-02 DIAGNOSIS — F330 Major depressive disorder, recurrent, mild: Secondary | ICD-10-CM | POA: Diagnosis not present

## 2023-07-02 NOTE — Progress Notes (Addendum)
[facility] psychotherapy note Name: Brandon White Date: 07/02/2023 MRN: [id number] DOB: 1991/10/26 PCP: Tally Joe, MD  Time spent: 52 minutes  Treatment: Individual therapy  Virtual Visit via Telehealth Note Connected with patient by a telemedicine/telehealth application, with their informed consent, and verified patient privacy and that I am speaking with the correct person using two identifiers. I discussed the limitations, risks, security and privacy concerns of performing psychotherapy and the availability of in person appointments. I also discussed with the patient that there may be a patient responsible charge related to this service. The patient expressed understanding and agreed to proceed. I discussed the treatment planning with the patient. The patient was provided an opportunity to ask questions and all were answered. The patient agreed with the plan and demonstrated an understanding of the instructions. The patient was advised to call  our office if  symptoms worsen or feel they are in a crisis state and need immediate contact.   Therapist Location: office Patient Location: home     Mental Status Exam:    Appearance:    Casual     Behavior:   Appropriate  Motor:   WNL  Speech/Language:    Clear and Coherent  Affect:   Full range   Mood:   Sad, pleasant  Thought process:   Logical, linear, goal directed  Thought content:     WNL  Sensory/Perceptual disturbances:     none  Orientation:   x4  Attention:   Good  Concentration:   Good  Memory:   Intact  Fund of knowledge:    Consistent with age and development  Insight:     Good  Judgment:    Good  Impulse Control:   Good     Reported Symptoms: Anxiety, obsessive thinking, feelings of guilt  Risk Assessment: Danger to Self:  No Self-injurious Behavior: No Danger to Others: No Duty to Warn:no Physical Aggression / Violence:No  Access to Firearms a concern: No  Gang Involvement:No  Patient /  guardian was educated about steps to take if suicide or homicide risk level increases between visits: yes While future psychiatric events cannot be accurately predicted, the patient does not currently require acute inpatient psychiatric care and does not currently meet [facility] involuntary commitment criteria.  Subjective: Patient engaged in telehealth session via video.  Patient shared how he and his wife remain separated, seeing each other for short increments of time each day typically when watching their children.  He continues to date his girlfriend and he stated that his wife is also dating someone.  He continues to share challenges they have had in their relationship, how he feels they are just "too different" going on to share his traditional view of marriage versus her wanting to have an open relationship and the impact of their making this decision.  He shared how he is going to try and manage financially, he stated that she currently does not work and does not plan to at this point and how she is also speculated about going back to school.  He shared the challenge of maintaining 2 households when he initially but is focused on providing for his children's needs.  Facilitated his identifying needs, ways to cope and care for himself or he continues his boxing exercise, working out.    Interventions: Supportive therapy, motivational interviewing   Diagnoses:    ICD-10-CM   1. Depression, major, recurrent, mild (HCC)  F33.0  Plan: Patient is to use coping as identified in session, utilize his support system, continue to practice self-care and stress reduction.   Long-term goal: Improve management of distressful emotions including his anxiety.        Improve coping and identification and processing of feelings.  Short-term goal: To identify and process feelings related to recent stressors, primarily marital.                   Continue to work to be mindful of thoughts  associated with increasing his anxiety and work to reframe them and use other coping skills as            identified in session.                                Assessment of progress:  progressing     Waldron Session, [facility]

## 2023-07-07 ENCOUNTER — Ambulatory Visit (INDEPENDENT_AMBULATORY_CARE_PROVIDER_SITE_OTHER): Payer: Self-pay | Admitting: Psychiatry

## 2023-07-07 DIAGNOSIS — Z91199 Patient's noncompliance with other medical treatment and regimen due to unspecified reason: Secondary | ICD-10-CM

## 2023-07-07 NOTE — Progress Notes (Signed)
No show

## 2023-07-11 ENCOUNTER — Other Ambulatory Visit: Payer: Self-pay | Admitting: Psychiatry

## 2023-07-11 DIAGNOSIS — F4001 Agoraphobia with panic disorder: Secondary | ICD-10-CM

## 2023-07-11 DIAGNOSIS — F5105 Insomnia due to other mental disorder: Secondary | ICD-10-CM

## 2023-07-11 DIAGNOSIS — M6289 Other specified disorders of muscle: Secondary | ICD-10-CM

## 2023-07-14 ENCOUNTER — Ambulatory Visit (INDEPENDENT_AMBULATORY_CARE_PROVIDER_SITE_OTHER): Payer: 59 | Admitting: Mental Health

## 2023-07-14 DIAGNOSIS — F330 Major depressive disorder, recurrent, mild: Secondary | ICD-10-CM

## 2023-07-14 NOTE — Progress Notes (Signed)
[facility] psychotherapy note Name: Brandon White Date: 07/14/2023 MRN: [id number] DOB: 05-10-1991 PCP: Tally Joe, MD  Time spent: 45 minutes Time in: 9: 03 a.m. time out 9: 4 9 AM  Treatment: Individual therapy   Mental Status Exam:    Appearance:    Casual     Behavior:   Appropriate  Motor:   WNL  Speech/Language:    Clear and Coherent  Affect:   Full range   Mood:   Sad, pleasant  Thought process:   Logical, linear, goal directed  Thought content:     WNL  Sensory/Perceptual disturbances:     none  Orientation:   x4  Attention:   Good  Concentration:   Good  Memory:   Intact  Fund of knowledge:    Consistent with age and development  Insight:     Good  Judgment:    Good  Impulse Control:   Good     Reported Symptoms: Anxiety, obsessive thinking, feelings of guilt  Risk Assessment: Danger to Self:  No Self-injurious Behavior: No Danger to Others: No Duty to Warn:no Physical Aggression / Violence:No  Access to Firearms a concern: No  Gang Involvement:No  Patient / guardian was educated about steps to take if suicide or homicide risk level increases between visits: yes While future psychiatric events cannot be accurately predicted, the patient does not currently require acute inpatient psychiatric care and does not currently meet [facility] involuntary commitment criteria.  Subjective: Patient arrived on time for today's session.  Patient shared recent events, focusing primarily on the ongoing events related to the separation between him and his wife.  He stated that his wife now has her own residence.  He stated that he identified a boundary with his wife regarding her coming back to the house.  He shared how he feels this boundary is needed and encouraged her to consider giving ample advanced notice as he will do the same.  He stated they will most likely have mediation at the end of next month.  At this point he is comfortable with the separation  and how they both have moved on, his currently dating his girlfriend and her also having a boyfriend.  He shared more history related to the challenges they had in their relationship over the past few years, specifically her introducing the idea about a year ago about significant changes to their relationship.  Patient shared how he never fully adjusted and how he feels this subsequently led to even more strain in their relationship.  Facilitated his identifying needs, identifying feelings and processing.    Interventions: Supportive therapy, motivational interviewing   Diagnoses:    ICD-10-CM   1. Depression, major, recurrent, mild (HCC)  F33.0       Plan: Patient is to use coping as identified in session, utilize his support system, continue to practice self-care and stress reduction.   Long-term goal: Improve management of distressful emotions including his anxiety.        Improve coping and identification and processing of feelings.  Short-term goal: To identify and process feelings related to recent stressors, primarily marital.                   Continue to work to be mindful of thoughts associated with increasing his anxiety and work to reframe them and use other coping skills as            identified in session.  Assessment of progress:  progressing     Waldron Session, [facility]

## 2023-07-29 ENCOUNTER — Ambulatory Visit (INDEPENDENT_AMBULATORY_CARE_PROVIDER_SITE_OTHER): Payer: 59 | Admitting: Mental Health

## 2023-07-29 NOTE — Progress Notes (Signed)
No chg for no show 07/29/23

## 2023-08-05 ENCOUNTER — Telehealth: Payer: Self-pay | Admitting: Psychiatry

## 2023-08-05 ENCOUNTER — Other Ambulatory Visit: Payer: Self-pay | Admitting: Psychiatry

## 2023-08-05 MED ORDER — FLUOXETINE HCL 40 MG PO CAPS: 80.0000 mg | ORAL_CAPSULE | Freq: Every day | ORAL | 0 refills | Status: DC

## 2023-08-05 NOTE — Telephone Encounter (Signed)
Patient called in stating that his traveling and has lost his Prozac medication. He would like a prescription sent to [facility] [address] [place], [place] Ph: [phone] Appt 10/24

## 2023-08-05 NOTE — Telephone Encounter (Signed)
Sent!

## 2023-08-12 ENCOUNTER — Ambulatory Visit (INDEPENDENT_AMBULATORY_CARE_PROVIDER_SITE_OTHER): Payer: 59 | Admitting: Mental Health

## 2023-08-12 DIAGNOSIS — F330 Major depressive disorder, recurrent, mild: Secondary | ICD-10-CM | POA: Diagnosis not present

## 2023-08-12 NOTE — Progress Notes (Unsigned)
[facility] psychotherapy note Name: Brandon White Date: 08/12/23 MRN: [id number] DOB: Mar 30, 1991 PCP: Tally Joe, MD  Time spent: 52 minutes Time in: 9: 00 a.m. time out 9:52 AM  Treatment: Individual therapy   Mental Status Exam:    Appearance:    Casual     Behavior:   Appropriate  Motor:   WNL  Speech/Language:    Clear and Coherent  Affect:   Full range   Mood:   Sad, pleasant  Thought process:   Logical, linear, goal directed  Thought content:     WNL  Sensory/Perceptual disturbances:     none  Orientation:   x4  Attention:   Good  Concentration:   Good  Memory:   Intact  Fund of knowledge:    Consistent with age and development  Insight:     Good  Judgment:    Good  Impulse Control:   Good     Reported Symptoms: Anxiety, obsessive thinking, feelings of guilt  Risk Assessment: Danger to Self:  No Self-injurious Behavior: No Danger to Others: No Duty to Warn:no Physical Aggression / Violence:No  Access to Firearms a concern: No  Gang Involvement:No  Patient / guardian was educated about steps to take if suicide or homicide risk level increases between visits: yes While future psychiatric events cannot be accurately predicted, the patient does not currently require acute inpatient psychiatric care and does not currently meet [facility] involuntary commitment criteria.  Subjective: Patient arrived on time for today's session.  Patient presents sharing recent stressors, primarily consisting of the ongoing challenges related to he and his wife separating.  He went on to share how he had a conference recently, how she, along with her current boyfriend went inside his residence.  Patient shared how he needs more of a boundary, although his wife disagreed with him stating she should have full access to the home.  Patient shared his attempts to communicate with her about how they both will need boundaries in their relationship going forward which will  primarily consist of their coparenting.  Provide support and space for patient to identify feelings related, facilitating his identifying needs.  He stated that he is trying to be consistent with his workouts although recently suffering an injury.  Sleep has been disrupted and we encouraged a consistent sleep regimen; he reports taking his medications consistently in part to assist with getting enough rest.   Interventions: Supportive therapy, motivational interviewing   Diagnoses:    ICD-10-CM   1. Depression, major, recurrent, mild (HCC)  F33.0        Plan: Patient is to use coping as identified in session, utilize his support system, continue to practice self-care and stress reduction.   Long-term goal: Improve management of distressful emotions including his anxiety.        Improve coping and identification and processing of feelings.  Short-term goal: To identify and process feelings related to recent stressors, primarily marital.                   Continue to work to be mindful of thoughts associated with increasing his anxiety and work to reframe them and use other coping skills as            identified in session.                                Assessment of progress:  progressing  Waldron Session, [facility]

## 2023-08-19 ENCOUNTER — Ambulatory Visit: Payer: 59 | Admitting: Mental Health

## 2023-08-20 ENCOUNTER — Telehealth: Payer: Self-pay | Admitting: Psychiatry

## 2023-08-20 ENCOUNTER — Other Ambulatory Visit: Payer: Self-pay

## 2023-08-20 DIAGNOSIS — F5105 Insomnia due to other mental disorder: Secondary | ICD-10-CM

## 2023-08-20 DIAGNOSIS — F4001 Agoraphobia with panic disorder: Secondary | ICD-10-CM

## 2023-08-20 DIAGNOSIS — M6289 Other specified disorders of muscle: Secondary | ICD-10-CM

## 2023-08-20 MED ORDER — ZOLPIDEM TARTRATE 10 MG PO TABS: 10.0000 mg | ORAL_TABLET | Freq: Every evening | ORAL | 0 refills | Status: DC | PRN

## 2023-08-20 MED ORDER — DIAZEPAM 5 MG PO TABS: 5.0000 mg | ORAL_TABLET | Freq: Three times a day (TID) | ORAL | 0 refills | Status: DC | PRN

## 2023-08-20 NOTE — Telephone Encounter (Signed)
Patient called I for refill on Zolpidem 10mg  and Diazepam 5mg . Ph: [phone] Appt 10/24 Pharmacy [facility] [address] [place]

## 2023-08-20 NOTE — Telephone Encounter (Signed)
Pended.

## 2023-09-04 ENCOUNTER — Ambulatory Visit: Payer: 59 | Admitting: Mental Health

## 2023-09-04 NOTE — Progress Notes (Addendum)
error 

## 2023-09-11 ENCOUNTER — Ambulatory Visit: Payer: 59 | Admitting: Mental Health

## 2023-09-11 ENCOUNTER — Encounter: Payer: Self-pay | Admitting: Psychiatry

## 2023-09-11 ENCOUNTER — Ambulatory Visit: Payer: 59 | Admitting: Psychiatry

## 2023-09-11 ENCOUNTER — Other Ambulatory Visit: Payer: Self-pay | Admitting: Psychiatry

## 2023-09-11 DIAGNOSIS — F330 Major depressive disorder, recurrent, mild: Secondary | ICD-10-CM

## 2023-09-11 DIAGNOSIS — F428 Other obsessive-compulsive disorder: Secondary | ICD-10-CM

## 2023-09-11 DIAGNOSIS — F5105 Insomnia due to other mental disorder: Secondary | ICD-10-CM | POA: Diagnosis not present

## 2023-09-11 DIAGNOSIS — F4001 Agoraphobia with panic disorder: Secondary | ICD-10-CM

## 2023-09-11 DIAGNOSIS — F902 Attention-deficit hyperactivity disorder, combined type: Secondary | ICD-10-CM

## 2023-09-11 MED ORDER — LISDEXAMFETAMINE DIMESYLATE 40 MG PO CAPS: 40.0000 mg | ORAL_CAPSULE | ORAL | 0 refills | Status: DC

## 2023-09-11 NOTE — Progress Notes (Signed)
[facility] psychotherapy note Name: Kiernan Bartlette Date: 09/11/23 MRN: [id number] DOB: Sep 03, 1991 PCP: Tally Joe, MD  Time spent: 45 minutes Time in: 11: 10 a.m. time out 11:55 AM  Treatment: Individual therapy   Mental Status Exam:    Appearance:    Casual     Behavior:   Appropriate  Motor:   WNL  Speech/Language:    Clear and Coherent  Affect:   Full range   Mood:   Sad, pleasant  Thought process:   Logical, linear, goal directed  Thought content:     WNL  Sensory/Perceptual disturbances:     none  Orientation:   x4  Attention:   Good  Concentration:   Good  Memory:   Intact  Fund of knowledge:    Consistent with age and development  Insight:     Good  Judgment:    Good  Impulse Control:   Good     Reported Symptoms: Anxiety, obsessive thinking, feelings of guilt  Risk Assessment: Danger to Self:  No Self-injurious Behavior: No Danger to Others: No Duty to Warn:no Physical Aggression / Violence:No  Access to Firearms a concern: No  Gang Involvement:No  Patient / guardian was educated about steps to take if suicide or homicide risk level increases between visits: yes While future psychiatric events cannot be accurately predicted, the patient does not currently require acute inpatient psychiatric care and does not currently meet [facility] involuntary commitment criteria.  Subjective: Patient a few minutes late for today's session.  He shared how he continues to adjust to separating from his wife.  Facilitated his identifying feelings, providing space for him to process thoughts, recent situations and events that have caused him to have some anxiety and stress.  He reports challenges with motivation, increased fatigue although he states he is sleeping enough, challenges with concentration and focus.   He stated that there is more of a clear boundary in terms of their living separately, he stated his wife is refrained from coming over unannounced  which is a request he had made.  He stated that he did not engage in mediation as he stated his wife declined after they had a disagreement regarding finances.  He went on to share more details related to their communication, stating that his wife has accused him of not communicating effectively enough and coparenting however, patient shared how he has made efforts such as ensuring that his children's needs are met day to day in various capacities.  Continue to work with patient from a cognitive behavioral framework, assisting him in identifying cognitions associated with some of his anxiety and worked with him to reframe and also encouraged him to follow through with journaling homework assignment between sessions.    Interventions: Supportive therapy, motivational interviewing, CBT   Diagnoses:    ICD-10-CM   1. Depression, major, recurrent, mild (HCC)  F33.0         Plan: Patient is to use coping as identified in session, utilize his support system, continue to practice self-care and stress reduction.   Long-term goal: Improve management of distressful emotions including his anxiety.        Improve coping and identification and processing of feelings.  Short-term goal: To identify and process feelings related to recent stressors, primarily marital.                   Continue to work to be mindful of thoughts associated with increasing his anxiety and work to reframe them  and use other coping skills as            identified in session.                                Assessment of progress:  progressing     Waldron Session, [facility]
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
## 2023-09-23 ENCOUNTER — Ambulatory Visit (INDEPENDENT_AMBULATORY_CARE_PROVIDER_SITE_OTHER): Payer: 59 | Admitting: Mental Health

## 2023-09-23 DIAGNOSIS — F330 Major depressive disorder, recurrent, mild: Secondary | ICD-10-CM

## 2023-09-23 NOTE — Progress Notes (Signed)
[facility] psychotherapy note Name: Brandon White Date: 09/23/23 MRN: [id number] DOB: [birth year] PCP: Tally Joe, MD  Time spent: 55 minutes Time in: 9:00 a.m. time out 9:55 AM  Treatment: Individual therapy   Mental Status Exam:    Appearance:    Casual     Behavior:   Appropriate  Motor:   WNL  Speech/Language:    Clear and Coherent  Affect:   Full range   Mood:   Sad, pleasant  Thought process:   Logical, linear, goal directed  Thought content:     WNL  Sensory/Perceptual disturbances:     none  Orientation:   x4  Attention:   Good  Concentration:   Good  Memory:   Intact  Fund of knowledge:    Consistent with age and development  Insight:     Good  Judgment:    Good  Impulse Control:   Good     Reported Symptoms: Anxiety, obsessive thinking, feelings of guilt  Risk Assessment: Danger to Self:  No Self-injurious Behavior: No Danger to Others: No Duty to Warn:no Physical Aggression / Violence:No  Access to Firearms a concern: No  Gang Involvement:No  Patient / guardian was educated about steps to take if suicide or homicide risk level increases between visits: yes While future psychiatric events cannot be accurately predicted, the patient does not currently require acute inpatient psychiatric care and does not currently meet [facility] involuntary commitment criteria.  Subjective: Patient arrived on time for today's session.  Assessed progress where he stated that he is adjusting to taking medication to address his ADHD symptoms.  He stated that thus far it has been helpful particularly with work tasks.  He reports being more focused with sustained attention levels.  He did report that when at home particularly in the afternoon or evening he struggles with motivation, completing some tasks.  He went on to share the current relationship challenges that have been ongoing with his wife with him he maintains separated.  Continuing to support his children  and his wife financially during this time.  He continues to adjust to the changes in their relationship at this point, is having no intention to reunite with his wife, reporting how they have both moved on with other relationships.  Provide space for patient to identify feelings related.  He went on to share some challenges in the relationship with his current girlfriend as she and her daughter are staying at his home often.  Ways to communicate in this relationship were explored collaboratively at this was identified as a need particularly when elevated anxiety levels persist.  He reports they both of had the challenge of working through adjustments in their relationship based on both past and current stressors.  Ways to continue to engage in outlets for stress management and self-care, he identified how he continues to his sparring workouts during boxing and how he has a match scheduled.   Interventions: Supportive therapy, motivational interviewing, CBT   Diagnoses:    ICD-10-CM   1. Depression, major, recurrent, mild (HCC)  F33.0          Plan: Patient is to use coping as identified in session, utilize his support system, continue to practice self-care and stress reduction.   Long-term goal: Improve management of distressful emotions including his anxiety.        Improve coping and identification and processing of feelings.  Short-term goal: To identify and process feelings related to recent stressors, primarily marital.  Continue to work to be mindful of thoughts associated with increasing his anxiety and work to reframe them and use other coping skills as            identified in session.                                Assessment of progress:  progressing     Waldron Session, [facility]

## 2023-10-10 ENCOUNTER — Ambulatory Visit: Payer: 59 | Admitting: Mental Health

## 2023-10-10 DIAGNOSIS — F330 Major depressive disorder, recurrent, mild: Secondary | ICD-10-CM | POA: Diagnosis not present

## 2023-10-10 DIAGNOSIS — F428 Other obsessive-compulsive disorder: Secondary | ICD-10-CM

## 2023-10-10 NOTE — Progress Notes (Signed)
[facility] psychotherapy note Name: Markeis Caylor Date: 10/10/23 MRN: [id number] DOB: [birth year] PCP: Tally Joe, MD  Time spent: 55 minutes Time in: 10:00 a.m. time out 10:55 AM  Treatment: Individual therapy   Mental Status Exam:    Appearance:    Casual     Behavior:   Appropriate  Motor:   WNL  Speech/Language:    Clear and Coherent  Affect:   Full range   Mood:   Sad, pleasant  Thought process:   Logical, linear, goal directed  Thought content:     WNL  Sensory/Perceptual disturbances:     none  Orientation:   x4  Attention:   Good  Concentration:   Good  Memory:   Intact  Fund of knowledge:    Consistent with age and development  Insight:     Good  Judgment:    Good  Impulse Control:   Good     Reported Symptoms: Anxiety, obsessive thinking, feelings of guilt  Risk Assessment: Danger to Self:  No Self-injurious Behavior: No Danger to Others: No Duty to Warn:no Physical Aggression / Violence:No  Access to Firearms a concern: No  Gang Involvement:No  Patient / guardian was educated about steps to take if suicide or homicide risk level increases between visits: yes While future psychiatric events cannot be accurately predicted, the patient does not currently require acute inpatient psychiatric care and does not currently meet [facility] involuntary commitment criteria.  Subjective: Patient arrived on time for today's session.  Assessed progress where he stated he is continuing to benefit from being able to focus more at work with recent medication change.  He stated that this has been relieving with some stress in this area of his life however, he states he continues to feel distressed related to his marital separation.  He identified and processed feelings of frustration due to the challenge of not being fully aware of future outcomes regarding finances, he continues to support his wife and children, expresses his content to continue.  Space was  provided for patient to process feelings related, provide support throughout as well as encouraged journaling.  Discussed concept of radical acceptance, provide encouragement for him to continue to cope and care for himself, exercise, utilize his support system.    Interventions: Supportive therapy, motivational interviewing, CBT   Diagnoses:    ICD-10-CM   1. Depression, major, recurrent, mild (HCC)  F33.0     2. Other obsessive-compulsive disorders  F42.8           Plan: Patient is to use coping as identified in session, utilize his support system, continue to practice self-care and stress reduction.   Long-term goal: Improve management of distressful emotions including his anxiety.        Improve coping and identification and processing of feelings.  Short-term goal: To identify and process feelings related to recent stressors, primarily marital.                   Continue to work to be mindful of thoughts associated with increasing his anxiety and work to reframe them and use other coping skills as            identified in session.                                Assessment of progress:  progressing     Waldron Session, [facility]

## 2023-10-15 ENCOUNTER — Other Ambulatory Visit: Payer: Self-pay | Admitting: Psychiatry

## 2023-10-15 DIAGNOSIS — F5105 Insomnia due to other mental disorder: Secondary | ICD-10-CM

## 2023-10-15 DIAGNOSIS — F4001 Agoraphobia with panic disorder: Secondary | ICD-10-CM

## 2023-10-15 DIAGNOSIS — M6289 Other specified disorders of muscle: Secondary | ICD-10-CM

## 2023-10-15 DIAGNOSIS — F902 Attention-deficit hyperactivity disorder, combined type: Secondary | ICD-10-CM

## 2023-10-15 MED ORDER — LISDEXAMFETAMINE DIMESYLATE 40 MG PO CAPS: 40.0000 mg | ORAL_CAPSULE | ORAL | 0 refills | Status: DC

## 2023-10-15 NOTE — Telephone Encounter (Signed)
LF 10/2 AND 10/24 LV 10/24 - PENDED VYVANSE IN PT CHART AS WELL.

## 2023-10-15 NOTE — Telephone Encounter (Signed)
Pt  needs a refill on his vyvanse 40 mg. Pharmacy is [facility] at [address]

## 2023-10-20 ENCOUNTER — Ambulatory Visit: Payer: 59 | Admitting: Mental Health

## 2023-10-20 NOTE — Progress Notes (Unsigned)
[facility] psychotherapy note Name: Dillard Hinrichsen Date: 10/10/23 MRN: [id number] DOB: 02/10/1991 PCP: Tally Joe, MD  Time spent: 55 minutes Time in: 10:00 a.m. time out 10:55 AM  Treatment: Individual therapy   Mental Status Exam:    Appearance:    Casual     Behavior:   Appropriate  Motor:   WNL  Speech/Language:    Clear and Coherent  Affect:   Full range   Mood:   Sad, pleasant  Thought process:   Logical, linear, goal directed  Thought content:     WNL  Sensory/Perceptual disturbances:     none  Orientation:   x4  Attention:   Good  Concentration:   Good  Memory:   Intact  Fund of knowledge:    Consistent with age and development  Insight:     Good  Judgment:    Good  Impulse Control:   Good     Reported Symptoms: Anxiety, obsessive thinking, feelings of guilt  Risk Assessment: Danger to Self:  No Self-injurious Behavior: No Danger to Others: No Duty to Warn:no Physical Aggression / Violence:No  Access to Firearms a concern: No  Gang Involvement:No  Patient / guardian was educated about steps to take if suicide or homicide risk level increases between visits: yes While future psychiatric events cannot be accurately predicted, the patient does not currently require acute inpatient psychiatric care and does not currently meet [facility] involuntary commitment criteria.  Subjective: Patient arrived on time for today's session.    Assessed progress where he stated he is continuing to benefit from being able to focus more at work with recent medication change.  He stated that this has been relieving with some stress in this area of his life however, he states he continues to feel distressed related to his marital separation.  He identified and processed feelings of frustration due to the challenge of not being fully aware of future outcomes regarding finances, he continues to support his wife and children, expresses his content to continue.  Space  was provided for patient to process feelings related, provide support throughout as well as encouraged journaling.  Discussed concept of radical acceptance, provide encouragement for him to continue to cope and care for himself, exercise, utilize his support system.    Interventions: Supportive therapy, motivational interviewing, CBT   Diagnoses:  No diagnosis found.       Plan: Patient is to use coping as identified in session, utilize his support system, continue to practice self-care and stress reduction.   Long-term goal: Improve management of distressful emotions including his anxiety.        Improve coping and identification and processing of feelings.  Short-term goal: To identify and process feelings related to recent stressors, primarily marital.                   Continue to work to be mindful of thoughts associated with increasing his anxiety and work to reframe them and use other coping skills as            identified in session.                                Assessment of progress:  progressing     Waldron Session, [facility]

## 2023-11-04 ENCOUNTER — Ambulatory Visit (INDEPENDENT_AMBULATORY_CARE_PROVIDER_SITE_OTHER): Payer: 59 | Admitting: Mental Health

## 2023-11-04 DIAGNOSIS — F330 Major depressive disorder, recurrent, mild: Secondary | ICD-10-CM | POA: Diagnosis not present
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
## 2023-11-17 ENCOUNTER — Other Ambulatory Visit: Payer: Self-pay | Admitting: Psychiatry

## 2023-11-17 DIAGNOSIS — M6289 Other specified disorders of muscle: Secondary | ICD-10-CM

## 2023-11-17 DIAGNOSIS — F5105 Insomnia due to other mental disorder: Secondary | ICD-10-CM

## 2023-11-17 DIAGNOSIS — F902 Attention-deficit hyperactivity disorder, combined type: Secondary | ICD-10-CM

## 2023-11-17 DIAGNOSIS — F4001 Agoraphobia with panic disorder: Secondary | ICD-10-CM

## 2023-11-17 MED ORDER — LISDEXAMFETAMINE DIMESYLATE 40 MG PO CAPS
40.0000 mg | ORAL_CAPSULE | ORAL | 0 refills | Status: DC
Start: 2023-11-17 — End: 2023-11-18

## 2023-11-17 NOTE — Telephone Encounter (Signed)
Appt tomorrow 12/31

## 2023-11-17 NOTE — Telephone Encounter (Signed)
Pt called asking for a refill on his vyvanse 40 mg as well

## 2023-11-18 ENCOUNTER — Other Ambulatory Visit: Payer: Self-pay

## 2023-11-18 ENCOUNTER — Telehealth: Payer: Self-pay | Admitting: Psychiatry

## 2023-11-18 ENCOUNTER — Telehealth: Payer: 59 | Admitting: Psychiatry

## 2023-11-18 DIAGNOSIS — F902 Attention-deficit hyperactivity disorder, combined type: Secondary | ICD-10-CM

## 2023-11-18 MED ORDER — LISDEXAMFETAMINE DIMESYLATE 40 MG PO CAPS
40.0000 mg | ORAL_CAPSULE | ORAL | 0 refills | Status: DC
Start: 2023-11-18 — End: 2023-12-23

## 2023-11-18 NOTE — Telephone Encounter (Signed)
Next appt is 2/27 at 3 pm. John's medicine was sent to the wrong pharmacy. They need to be filled at:  [id number] - [place], [place] - [address] AT [facility] [address] [facility] [address]   Phone: [phone]  Fax: [phone]

## 2023-11-18 NOTE — Telephone Encounter (Signed)
Canceled [facility] at [facility] [address], pended to [address].

## 2023-11-18 NOTE — Telephone Encounter (Signed)
Called patient as RFs were sent to [address] as requested. He said that pharmacy did not have Vyvanse in stock and is asking for Rx to be [facility] to [facility] [address] [facility] - only for the Vyvanse.

## 2023-12-20 ENCOUNTER — Other Ambulatory Visit: Payer: Self-pay | Admitting: Psychiatry

## 2023-12-20 DIAGNOSIS — F5105 Insomnia due to other mental disorder: Secondary | ICD-10-CM

## 2023-12-20 DIAGNOSIS — F4001 Agoraphobia with panic disorder: Secondary | ICD-10-CM

## 2023-12-20 DIAGNOSIS — M6289 Other specified disorders of muscle: Secondary | ICD-10-CM

## 2023-12-22 ENCOUNTER — Telehealth: Payer: Self-pay | Admitting: Psychiatry

## 2023-12-22 NOTE — Telephone Encounter (Signed)
Brandon White called and LM at 10:48am 2/1 to request refill of his Vyvanse.  He is travelling today 2/3 and wants to be able to pick it up this morning.  Appt 2/27.  Send to   [id number] - [place], [place] - [address] AT [address] OF [facility] [address]

## 2023-12-23 ENCOUNTER — Other Ambulatory Visit: Payer: Self-pay

## 2023-12-23 ENCOUNTER — Telehealth: Payer: Self-pay | Admitting: Psychiatry

## 2023-12-23 DIAGNOSIS — M6289 Other specified disorders of muscle: Secondary | ICD-10-CM

## 2023-12-23 DIAGNOSIS — F902 Attention-deficit hyperactivity disorder, combined type: Secondary | ICD-10-CM

## 2023-12-23 DIAGNOSIS — F5105 Insomnia due to other mental disorder: Secondary | ICD-10-CM

## 2023-12-23 DIAGNOSIS — F4001 Agoraphobia with panic disorder: Secondary | ICD-10-CM

## 2023-12-23 MED ORDER — LISDEXAMFETAMINE DIMESYLATE 40 MG PO CAPS: 40.0000 mg | ORAL_CAPSULE | ORAL | 0 refills | Status: DC

## 2023-12-23 MED ORDER — ZOLPIDEM TARTRATE 10 MG PO TABS: 10.0000 mg | ORAL_TABLET | Freq: Every evening | ORAL | 0 refills | Status: DC | PRN

## 2023-12-23 MED ORDER — DIAZEPAM 5 MG PO TABS: 5.0000 mg | ORAL_TABLET | Freq: Three times a day (TID) | ORAL | 0 refills | Status: DC | PRN

## 2023-12-23 NOTE — Telephone Encounter (Signed)
Pt lvm that he was not able to get his meds before he went out of town. He needs his valium,ambien and his vyvanse cacelled at local pharmacy and send to [facility] [address] [place] [employer] [address]

## 2023-12-23 NOTE — Telephone Encounter (Signed)
I cx his rx at the local pharm. And have tried multiple times to put in the pharm he has rqstd them be sent to but its not pulling up. Can you try to add it  please.   [address] [place] [address] [phone]

## 2023-12-24 NOTE — Telephone Encounter (Signed)
 Rx's were sent by Dr. Toi Foster

## 2023-12-25 NOTE — Telephone Encounter (Signed)
 Brandon White called and LM at 3:21 stating that he is having to come home early due daughters medical condition.  So now he wants he Vyvanse , Valium  and Ambien  .all switch from [place]  where [facility] [address]

## 2023-12-26 NOTE — Telephone Encounter (Signed)
 Pt lvm today asking for his [facility] to be [facility] [address]

## 2023-12-29 NOTE — Telephone Encounter (Signed)
 Pt lvm  stating that  he is still waiting for his vyvanse  40 mg to the [facility] at [address]

## 2023-12-30 ENCOUNTER — Telehealth: Payer: Self-pay | Admitting: Psychiatry

## 2023-12-30 ENCOUNTER — Other Ambulatory Visit: Payer: Self-pay

## 2023-12-30 DIAGNOSIS — F902 Attention-deficit hyperactivity disorder, combined type: Secondary | ICD-10-CM

## 2023-12-30 MED ORDER — LISDEXAMFETAMINE DIMESYLATE 40 MG PO CAPS
40.0000 mg | ORAL_CAPSULE | ORAL | 0 refills | Status: DC
Start: 2023-12-30 — End: 2024-01-15

## 2023-12-30 NOTE — Telephone Encounter (Signed)
Cx at pharm. In [place]. Pended to local pharm.

## 2023-12-30 NOTE — Telephone Encounter (Signed)
Pt has called several times. He needs his vyvanse cancelled in [place] and resend [facility] the [facility] at [address]

## 2023-12-30 NOTE — Telephone Encounter (Signed)
Pharmcy is currently closed for lunch will call back at 2.

## 2024-01-15 ENCOUNTER — Encounter: Payer: Self-pay | Admitting: Psychiatry

## 2024-01-15 ENCOUNTER — Ambulatory Visit (INDEPENDENT_AMBULATORY_CARE_PROVIDER_SITE_OTHER): Payer: 59 | Admitting: Psychiatry

## 2024-01-15 DIAGNOSIS — F4001 Agoraphobia with panic disorder: Secondary | ICD-10-CM | POA: Diagnosis not present

## 2024-01-15 DIAGNOSIS — F428 Other obsessive-compulsive disorder: Secondary | ICD-10-CM

## 2024-01-15 DIAGNOSIS — F330 Major depressive disorder, recurrent, mild: Secondary | ICD-10-CM

## 2024-01-15 DIAGNOSIS — F5105 Insomnia due to other mental disorder: Secondary | ICD-10-CM

## 2024-01-15 DIAGNOSIS — F902 Attention-deficit hyperactivity disorder, combined type: Secondary | ICD-10-CM

## 2024-01-15 MED ORDER — LISDEXAMFETAMINE DIMESYLATE 50 MG PO CAPS: 50.0000 mg | ORAL_CAPSULE | ORAL | 0 refills | Status: DC
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
## 2024-02-04 ENCOUNTER — Telehealth: Payer: Self-pay

## 2024-02-04 ENCOUNTER — Other Ambulatory Visit: Payer: Self-pay

## 2024-02-04 DIAGNOSIS — F4001 Agoraphobia with panic disorder: Secondary | ICD-10-CM

## 2024-02-04 DIAGNOSIS — F5105 Insomnia due to other mental disorder: Secondary | ICD-10-CM

## 2024-02-04 DIAGNOSIS — M6289 Other specified disorders of muscle: Secondary | ICD-10-CM

## 2024-02-04 NOTE — Telephone Encounter (Signed)
Need pharmacy

## 2024-02-05 MED ORDER — ZOLPIDEM TARTRATE 10 MG PO TABS: 10.0000 mg | ORAL_TABLET | Freq: Every evening | ORAL | 2 refills | Status: DC | PRN

## 2024-02-05 MED ORDER — DIAZEPAM 5 MG PO TABS: 5.0000 mg | ORAL_TABLET | Freq: Three times a day (TID) | ORAL | 2 refills | Status: DC | PRN

## 2024-02-05 NOTE — Telephone Encounter (Signed)
 Pt called 5:44 pm 3/19 requesting Rx for Zolpidem to [facility] [address]

## 2024-02-05 NOTE — Telephone Encounter (Signed)
 Addressed thru pharmacy interface.

## 2024-03-12 ENCOUNTER — Ambulatory Visit: Admitting: Mental Health

## 2024-03-12 DIAGNOSIS — F330 Major depressive disorder, recurrent, mild: Secondary | ICD-10-CM

## 2024-03-12 NOTE — Progress Notes (Signed)
 [facility] psychotherapy note Name: Brandon White Date: 03/12/24 MRN: [id number] DOB: [birth year] PCP: Rae Bugler, MD  Time spent: 54 minutes Time in: 9:00 a.m. time out 9:54 a.m.  Treatment: Individual therapy    Mental Status Exam:    Appearance:    Casual     Behavior:   Appropriate  Motor:   WNL  Speech/Language:    Clear and Coherent  Affect:   Full range   Mood:   Pleasant, some sadness  Thought process:   Logical, linear, goal directed  Thought content:     WNL  Sensory/Perceptual disturbances:     none  Orientation:   x4  Attention:   Good  Concentration:   Good  Memory:   Intact  Fund of knowledge:    Consistent with age and development  Insight:     Good  Judgment:    Good  Impulse Control:   Good     Reported Symptoms: Anxiety, obsessive thinking, sadness, rumination, challenges with motivation  Risk Assessment: Danger to Self:  No Self-injurious Behavior: No Danger to Others: No Duty to Warn:no Physical Aggression / Violence:No  Access to Firearms a concern: No  Gang Involvement:No  Patient / guardian was educated about steps to take if suicide or homicide risk level increases between visits: yes While future psychiatric events cannot be accurately predicted, the patient does not currently require acute inpatient psychiatric care and does not currently meet [place]  involuntary commitment criteria.  Subjective: Patient shared progress since last visit which was about 4 months ago.  He stated that he is continue to feel levels of depression due to ongoing stress.  He went on to share challenges he continues to have at times with his ex-wife, they remain separated and attempted coparent with communication regarding their children as needed.  He continues to have some financial stress as he continues to support her through her as she is not working, although they alternate custody every other week with her 2 children.  Went on to share some  challenges in the relationship with his current girlfriend, he identified how they continue to support 1 another as they cope with different life adjustments.  The additional stress, regarding his worry about his teenage daughter was also shared.  He shared his attempts to be supportive of her sharing how he is trying to be reassuring and supportive as she has had some stress in the relationship with her mother.  He identified the need to try and make time for himself or self-cares such as working out.  He plans to try and follow through although his schedule with work, home responsibilities and his children has been challenging to be able to exercise.   Interventions: Supportive therapy, motivational interviewing, CBT   Diagnoses:    ICD-10-CM   1. Depression, major, recurrent, mild (HCC)  F33.0        Plan: Patient is to use coping as identified in session, utilize his support system, continue to practice self-care and stress reduction.   Long-term goal: Improve management of distressful emotions including his anxiety.        Improve coping and identification and processing of feelings to improve coping with stress.  Short-term goal: To identify and process feelings related to recent stressors, primarily marital.                   Continue to work to be mindful of thoughts associated with increasing his anxiety and work to reframe them  and use other coping skill                              as identified in session.                   Follow through with self-care such as his continuing to exercise and get enough rest.                                Assessment of progress:  progressing     Avram Lenis, [facility]

## 2024-03-17 ENCOUNTER — Telehealth: Payer: Self-pay | Admitting: Psychiatry

## 2024-03-17 ENCOUNTER — Other Ambulatory Visit: Payer: Self-pay

## 2024-03-17 DIAGNOSIS — F902 Attention-deficit hyperactivity disorder, combined type: Secondary | ICD-10-CM

## 2024-03-17 MED ORDER — LISDEXAMFETAMINE DIMESYLATE 50 MG PO CAPS
50.0000 mg | ORAL_CAPSULE | ORAL | 0 refills | Status: DC
Start: 2024-03-17 — End: 2024-04-23

## 2024-03-17 NOTE — Telephone Encounter (Signed)
 Pended Vyvanse  50 mg to [facility] [address]

## 2024-03-17 NOTE — Telephone Encounter (Signed)
 Pt called 8:26 pm requesting

## 2024-03-17 NOTE — Telephone Encounter (Signed)
 Pt called 8:26 pm requesting Rx for 50 mg Vyvanse . [facility] [address]

## 2024-03-17 NOTE — Telephone Encounter (Signed)
 Pt called 8:26 pm requesting Rx for 50 mg Vyvanse  to [facility] [address]

## 2024-03-25 ENCOUNTER — Ambulatory Visit: Admitting: Mental Health

## 2024-03-25 DIAGNOSIS — F330 Major depressive disorder, recurrent, mild: Secondary | ICD-10-CM

## 2024-03-25 NOTE — Progress Notes (Unsigned)
 [facility] psychotherapy note Name: Brandon White Date:  03/25/24 MRN: [id number] DOB: Oct 03, 1991 PCP: Rae Bugler, MD  Time spent: 51 minutes Time in: 10 AM time out: 10: 5 1 AM  Treatment: Individual therapy    Mental Status Exam:    Appearance:    Casual     Behavior:   Appropriate  Motor:   WNL  Speech/Language:    Clear and Coherent  Affect:   Full range   Mood:   Pleasant, some sadness  Thought process:   Logical, linear, goal directed  Thought content:     WNL  Sensory/Perceptual disturbances:     none  Orientation:   x4  Attention:   Good  Concentration:   Good  Memory:   Intact  Fund of knowledge:    Consistent with age and development  Insight:     Good  Judgment:    Good  Impulse Control:   Good     Reported Symptoms: Anxiety, obsessive thinking, sadness, rumination, challenges with motivation  Risk Assessment: Danger to Self:  No Self-injurious Behavior: No Danger to Others: No Duty to Warn:no Physical Aggression / Violence:No  Access to Firearms a concern: No  Gang Involvement:No  Patient / guardian was educated about steps to take if suicide or homicide risk level increases between visits: yes While future psychiatric events cannot be accurately predicted, the patient does not currently require acute inpatient psychiatric care and does not currently meet [place]  involuntary commitment criteria.  Subjective: Patient shared progress.  He reports ongoing stress identifying several stressors.  Stressors related to the ongoing challenges with his wife, they remain separated.  Both in separate relationships, the patient dating his girlfriend and his wife also having a boyfriend.  He stated that he continues to identify some financial stress related to being financially responsible for 2 households.    Interventions: Supportive therapy, motivational interviewing, CBT   Diagnoses:  No diagnosis found.    Plan: Patient is to use  coping as identified in session, utilize his support system, continue to practice self-care and stress reduction.   Long-term goal: Improve management of distressful emotions including his anxiety.        Improve coping and identification and processing of feelings to improve coping with stress.  Short-term goal: To identify and process feelings related to recent stressors, primarily marital.                   Continue to work to be mindful of thoughts associated with increasing his anxiety and work to reframe them and use other coping skill                              as identified in session.                   Follow through with self-care such as his continuing to exercise and get enough rest.                                Assessment of progress:  progressing     Avram Lenis, [facility]

## 2024-04-08 ENCOUNTER — Ambulatory Visit (INDEPENDENT_AMBULATORY_CARE_PROVIDER_SITE_OTHER): Admitting: Mental Health

## 2024-04-08 DIAGNOSIS — F428 Other obsessive-compulsive disorder: Secondary | ICD-10-CM | POA: Diagnosis not present

## 2024-04-08 DIAGNOSIS — F902 Attention-deficit hyperactivity disorder, combined type: Secondary | ICD-10-CM

## 2024-04-08 DIAGNOSIS — F330 Major depressive disorder, recurrent, mild: Secondary | ICD-10-CM

## 2024-04-08 NOTE — Progress Notes (Signed)
 [facility] psychotherapy note Name: Brandon White Date:  04/08/24 MRN: [id number] DOB: 12-25-1990 PCP: Brandon Bugler, MD  Time spent: 50 minutes Time start: 10 AM time Stop: 10:50 AM  Treatment: Individual therapy  Virtual Visit via Telehealth Note Connected with patient by a telemedicine/telehealth application, with their informed consent, and verified patient privacy and that I am speaking with the correct person using two identifiers. I discussed the limitations, risks, security and privacy concerns of performing psychotherapy and the availability of in person appointments. I also discussed with the patient that there may be a patient responsible charge related to this service. The patient expressed understanding and agreed to proceed. I discussed the treatment planning with the patient. The patient was provided an opportunity to ask questions and all were answered. The patient agreed with the plan and demonstrated an understanding of the instructions. The patient was advised to call  our office if  symptoms worsen or feel they are in a crisis state and need immediate contact.   Therapist Location: office Patient Location: home      Mental Status Exam:    Appearance:    Casual     Behavior:   Appropriate  Motor:   WNL  Speech/Language:    Clear and Coherent  Affect:   Full range   Mood:   Anxious, pleasant  Thought process:   Logical, linear, goal directed  Thought content:     WNL  Sensory/Perceptual disturbances:     none  Orientation:   x4  Attention:   Good  Concentration:   Good  Memory:   Intact  Fund of knowledge:    Consistent with age and development  Insight:     Good  Judgment:    Good  Impulse Control:   Good     Reported Symptoms: Anxiety, obsessive thinking, sadness, rumination, challenges with motivation  Risk Assessment: Danger to Self:  No Self-injurious Behavior: No Danger to Others: No Duty to Warn:no Physical Aggression / Violence:No   Access to Firearms a concern: No  Gang Involvement:No  Patient / guardian was educated about steps to take if suicide or homicide risk level increases between visits: yes While future psychiatric events cannot be accurately predicted, the patient does not currently require acute inpatient psychiatric care and does not currently meet [place]  involuntary commitment criteria.  Subjective: Patient engaged in telehealth visit via video.  Patient shared progress.  Reports continued stress.  Stated that he has struggled recently with some aspects of self-care, not eating quite as healthy and has struggled to be consistent with his workout regimen. He shared thoughts related to challenges with his daily schedule.  He reported that he has his kids every other week, is able to get more work done related to his job the weeks he does not have his children however, is a struggle to sometimes keep up with work when his kids are at home as he is more focused on trying to spend quality time with them.  He went on to share how he has been at his current job for the past 6 years, has been highly dependable and efficient, continues to perform at a high level however, as tendencies thinking he should do more at times.  He also identified feeling this way about having enough time with his children, being there for them enough, recognizing how he only sees them every other week which has been upsetting and challenging.  He identified his efforts in trying to balance all  the kids needs, while also that of his girlfriends child who is often present throughout the week at his house. 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  Discussed STOPP skill related to the identified need of his wanting to not feel overwhelmed and improve emotional regulation.     Interventions: Supportive therapy, motivational interviewing, CBT   Diagnoses:    ICD-10-CM   1. Depression, major, recurrent, mild (HCC)  F33.0     2. Attention deficit hyperactivity disorder (ADHD), combined type  F90.2     3. Other obsessive-compulsive disorders  F42.8         Plan: Patient is to use coping as identified in session, utilize his support system, continue to practice self-care and stress reduction.   Long-term goal: Improve management of distressful emotions including his anxiety.        Improve coping and identification and processing of feelings to improve coping with stress.  Short-term goal: To identify and process feelings related to recent stressors, primarily marital.                   Continue to work to be mindful of thoughts associated with increasing his anxiety and work to reframe them and use other coping skill                              as identified in session.                   Follow through with self-care such as his continuing to exercise and get enough rest.                                Assessment of progress:  progressing     Avram Lenis, [facility]

## 2024-04-14 ENCOUNTER — Ambulatory Visit (INDEPENDENT_AMBULATORY_CARE_PROVIDER_SITE_OTHER): Admitting: Mental Health

## 2024-04-14 DIAGNOSIS — F428 Other obsessive-compulsive disorder: Secondary | ICD-10-CM | POA: Diagnosis not present

## 2024-04-14 NOTE — Progress Notes (Signed)
 [facility] psychotherapy note Name: Brandon White Date:  04/14/24 MRN: [id number] DOB: 10-09-1991 PCP: Rae Bugler, MD  Time spent: 51 minutes Time start: 10 AM time Stop: 10:51 AM  Treatment: Individual therapy     Mental Status Exam:    Appearance:    Casual     Behavior:   Appropriate  Motor:   WNL  Speech/Language:    Clear and Coherent  Affect:   Full range   Mood:   Anxious, pleasant  Thought process:   Logical, linear, goal directed  Thought content:     WNL  Sensory/Perceptual disturbances:     none  Orientation:   x4  Attention:   Good  Concentration:   Good  Memory:   Intact  Fund of knowledge:    Consistent with age and development  Insight:     Good  Judgment:    Good  Impulse Control:   Good     Reported Symptoms: Anxiety, obsessive thinking, sadness, rumination, challenges with motivation  Risk Assessment: Danger to Self:  No Self-injurious Behavior: No Danger to Others: No Duty to Warn:no Physical Aggression / Violence:No  Access to Firearms a concern: No  Gang Involvement:No  Patient / guardian was educated about steps to take if suicide or homicide risk level increases between visits: yes While future psychiatric events cannot be accurately predicted, the patient does not currently require acute inpatient psychiatric care and does not currently meet [place]  involuntary commitment criteria.  Subjective: Patient presented for session on time.  Assessed progress where he shared that he continues to manage responsibilities, working full-time, caring for his children who stay with him every other week, continuing to provide for them as well as to assist financially with his wife's names as he continues to pay monthly support.  He shared the ongoing challenges with stress, the pressure of keeping up with the many responsibilities.  He values his time with his children, focused on meeting their needs and insuring where met with  financially and emotionally.  He identified the need to continue to cultivate the relationship with his girlfriend's child.  He shared aspects of the relationship with his girlfriend, relationship going well currently.  He was able to identify stressors related to his marital separation.  Facilitated his identifying needs, ways to cope and care for himself that are meaningful where he stated that he started working out again in the last few days and this has been helpful to reduce some stress.  He is considering going back at his boxing gym.  He expressed some self disappointment with getting off track with his workouts; patient was encouraged to recognize his balancing many life stressors and facilitated his framing needs and where he needs to follow through.   Interventions: Supportive therapy, motivational interviewing, CBT   Diagnoses:    ICD-10-CM   1. Other obsessive-compulsive disorders  F42.8          Plan: Patient is to use coping as identified in session, utilize his support system, continue to practice self-care and stress reduction.   Long-term goal: Improve management of distressful emotions including his anxiety.        Improve coping and identification and processing of feelings to improve coping with stress.  Short-term goal: To identify and process feelings related to recent stressors, primarily marital.                   Continue to work to be mindful of thoughts associated with increasing  his anxiety and work to reframe them and use other coping skill                              as identified in session.                   Follow through with self-care such as his continuing to exercise and get enough rest.                                Assessment of progress:  progressing     Avram Lenis, [facility]

## 2024-04-23 ENCOUNTER — Other Ambulatory Visit: Payer: Self-pay

## 2024-04-23 ENCOUNTER — Telehealth: Payer: Self-pay | Admitting: Psychiatry

## 2024-04-23 DIAGNOSIS — F902 Attention-deficit hyperactivity disorder, combined type: Secondary | ICD-10-CM

## 2024-04-23 MED ORDER — LISDEXAMFETAMINE DIMESYLATE 50 MG PO CAPS: 50.0000 mg | ORAL_CAPSULE | ORAL | 0 refills | Status: DC

## 2024-04-23 NOTE — Telephone Encounter (Signed)
 Pended Vyvanse  50 to [facility] [address]

## 2024-04-23 NOTE — Telephone Encounter (Signed)
 Pt  made an appt for 05/20/24. He needs a refill on his vyvanse  50 mg. [facility] is [facility] [address]

## 2024-04-28 ENCOUNTER — Ambulatory Visit: Admitting: Mental Health

## 2024-04-28 DIAGNOSIS — F330 Major depressive disorder, recurrent, mild: Secondary | ICD-10-CM

## 2024-04-28 NOTE — Progress Notes (Signed)
 [facility] psychotherapy note Name: Brandon White Date:  04/28/24 MRN: [id number] DOB: 01-Jan-1991 PCP: Seabron Lenis, MD  Time spent: 50 minutes Time start: 9:00 AM time Stop: 9:50 AM  Treatment: Individual therapy     Mental Status Exam:    Appearance:    Casual     Behavior:   Appropriate  Motor:   WNL  Speech/Language:    Clear and Coherent  Affect:   Full range   Mood:   Sad, pleasant  Thought process:   Logical, linear, goal directed  Thought content:     WNL  Sensory/Perceptual disturbances:     none  Orientation:   x4  Attention:   Good  Concentration:   Good  Memory:   Intact  Fund of knowledge:    Consistent with age and development  Insight:     Good  Judgment:    Good  Impulse Control:   Good     Reported Symptoms: Anxiety, obsessive thinking, sadness, rumination, challenges with motivation  Risk Assessment: Danger to Self:  No Self-injurious Behavior: No Danger to Others: No Duty to Warn:no Physical Aggression / Violence:No  Access to Firearms a concern: No  Gang Involvement:No  Patient / guardian was educated about steps to take if suicide or homicide risk level increases between visits: yes While future psychiatric events cannot be accurately predicted, the patient does not currently require acute inpatient psychiatric care and does not currently meet [place]  involuntary commitment criteria.  Subjective: Patient presented for session on time.  Patient continues to report levels of stress due to day-to-day responsibilities.  He expressed how he continues to manage well also identifying the need to focus on self-care primarily through exercise as he wants to be more consistent in the coming weeks.  He continues to navigate the separation from his wife, continues to provide financial support.  Time spent to allow for patient to process challenges with this relationship.  He also shared more history related to his own family history,  relational dynamics with his family members.  He would like his father to spend more of his free time engaging with him and his children, however, he stated this has been an ongoing issue and is uncertain if he wants to further discuss with him.  Patient was encouraged to continue to give this consideration while providing support, understanding and facilitating his identifying needs.    Interventions: Supportive therapy, motivational interviewing, CBT   Diagnoses:    ICD-10-CM   1. Depression, major, recurrent, mild (HCC)  F33.0           Plan: Patient is to use coping as identified in session, utilize his support system, continue to practice self-care and stress reduction.   Long-term goal: Improve management of distressful emotions including his anxiety.        Improve coping and identification and processing of feelings to improve coping with stress.  Short-term goal: To identify and process feelings related to recent stressors, primarily marital.                   Continue to work to be mindful of thoughts associated with increasing his anxiety and work to reframe them and use other coping skill                              as identified in session.  Follow through with self-care such as his continuing to exercise and get enough rest.                                Assessment of progress:  progressing     Lonni Fischer, [facility]

## 2024-05-05 ENCOUNTER — Ambulatory Visit: Admitting: Mental Health

## 2024-05-05 DIAGNOSIS — F330 Major depressive disorder, recurrent, mild: Secondary | ICD-10-CM

## 2024-05-05 NOTE — Progress Notes (Signed)
 [facility] psychotherapy note Name: Brandon White Date:  05/05/24 MRN: [id number] DOB: 12-Oct-1991 PCP: Rae Bugler, MD  Time spent: 52 minutes Time start:  11 AM time out: 11:52 Am  Treatment: Individual therapy     Mental Status Exam:    Appearance:    Casual     Behavior:   Appropriate  Motor:   WNL  Speech/Language:    Clear and Coherent  Affect:   Full range   Mood:   Anxious, pleasant  Thought process:   Logical, linear, goal directed  Thought content:     WNL  Sensory/Perceptual disturbances:     none  Orientation:   x4  Attention:   Good  Concentration:   Good  Memory:   Intact  Fund of knowledge:    Consistent with age and development  Insight:     Good  Judgment:    Good  Impulse Control:   Good     Reported Symptoms: Anxiety, obsessive thinking, sadness, rumination, challenges with motivation  Risk Assessment: Danger to Self:  No Self-injurious Behavior: No Danger to Others: No Duty to Warn:no Physical Aggression / Violence:No  Access to Firearms a concern: No  Gang Involvement:No  Patient / guardian was educated about steps to take if suicide or homicide risk level increases between visits: yes While future psychiatric events cannot be accurately predicted, the patient does not currently require acute inpatient psychiatric care and does not currently meet [place]  involuntary commitment criteria.  Subjective: Patient presented for session on time.   Patient arrived on time for today's session.  Assessed progress, he stated he has made efforts to try to be consistent with his workout routine.  Reports work has been somewhat slow over the past week which has been helpful for him to have more down time.  He shared some stress related to the ongoing separation details related to he and his wife.  He went on to share how he is committed to the relationship with his girlfriend, they were doing well overall with their relationship, however, he  stated that they have some differences.  He went on to share how they had discussions about his needing to be more engaging with her child, although he states that he makes effort although he knows that they have different parenting styles.  Ways to communicate in the relationship was explored as this was identified as a need.  He plans to visit his godmother in [place] next week, looks forward to this vacation.   Interventions: Supportive therapy, motivational interviewing, CBT   Diagnoses:  No diagnosis found.      Plan: Patient is to use coping as identified in session, utilize his support system, continue to practice self-care and stress reduction.   Long-term goal: Improve management of distressful emotions including his anxiety.        Improve coping and identification and processing of feelings to improve coping with stress.  Short-term goal: To identify and process feelings related to recent stressors, primarily marital.                   Continue to work to be mindful of thoughts associated with increasing his anxiety and work to reframe them and use other coping skill                              as identified in session.  Follow through with self-care such as his continuing to exercise and get enough rest.                                Assessment of progress:  progressing     Avram Lenis, [facility]

## 2024-05-20 ENCOUNTER — Encounter: Payer: Self-pay | Admitting: Psychiatry

## 2024-05-20 ENCOUNTER — Telehealth: Admitting: Psychiatry

## 2024-05-20 DIAGNOSIS — F4001 Agoraphobia with panic disorder: Secondary | ICD-10-CM | POA: Diagnosis not present

## 2024-05-20 DIAGNOSIS — F330 Major depressive disorder, recurrent, mild: Secondary | ICD-10-CM | POA: Diagnosis not present

## 2024-05-20 DIAGNOSIS — F428 Other obsessive-compulsive disorder: Secondary | ICD-10-CM | POA: Diagnosis not present

## 2024-05-20 DIAGNOSIS — F902 Attention-deficit hyperactivity disorder, combined type: Secondary | ICD-10-CM | POA: Diagnosis not present

## 2024-05-20 DIAGNOSIS — M6289 Other specified disorders of muscle: Secondary | ICD-10-CM

## 2024-05-20 DIAGNOSIS — F5105 Insomnia due to other mental disorder: Secondary | ICD-10-CM

## 2024-05-20 MED ORDER — LISDEXAMFETAMINE DIMESYLATE 50 MG PO CAPS: 50.0000 mg | ORAL_CAPSULE | ORAL | 0 refills | Status: DC

## 2024-05-20 MED ORDER — LISDEXAMFETAMINE DIMESYLATE 50 MG PO CAPS: 50.0000 mg | ORAL_CAPSULE | Freq: Every day | ORAL | 0 refills | Status: AC

## 2024-05-20 MED ORDER — DIAZEPAM 5 MG PO TABS: 5.0000 mg | ORAL_TABLET | Freq: Three times a day (TID) | ORAL | 2 refills | Status: DC | PRN

## 2024-05-20 MED ORDER — AMPHETAMINE-DEXTROAMPHETAMINE 15 MG PO TABS: ORAL_TABLET | ORAL | 0 refills | Status: DC

## 2024-05-20 MED ORDER — ZOLPIDEM TARTRATE 10 MG PO TABS: 10.0000 mg | ORAL_TABLET | Freq: Every evening | ORAL | 3 refills | Status: DC | PRN

## 2024-05-20 MED ORDER — FLUOXETINE HCL 40 MG PO CAPS: 80.0000 mg | ORAL_CAPSULE | Freq: Every day | ORAL | 1 refills | Status: DC

## 2024-05-20 MED ORDER — LISDEXAMFETAMINE DIMESYLATE 50 MG PO CAPS: 50.0000 mg | ORAL_CAPSULE | Freq: Every day | ORAL | 0 refills | Status: DC
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
## 2024-05-25 ENCOUNTER — Ambulatory Visit: Admitting: Mental Health

## 2024-05-25 DIAGNOSIS — F330 Major depressive disorder, recurrent, mild: Secondary | ICD-10-CM

## 2024-05-25 NOTE — Progress Notes (Addendum)
 [facility] psychotherapy note Name: Fulton Merry Date:  05/25/24 MRN: [id number] DOB: 1991/06/10 PCP: Seabron Lenis, MD  Time spent: 51 minutes Time start:  11 AM time out: 11:51 Am  Treatment: Individual therapy     Mental Status Exam:    Appearance:    Casual     Behavior:   Appropriate  Motor:   WNL  Speech/Language:    Clear and Coherent  Affect:   Full range   Mood:   Sad, pleasant  Thought process:   Logical, linear, goal directed  Thought content:     WNL  Sensory/Perceptual disturbances:     none  Orientation:   x4  Attention:   Good  Concentration:   Good  Memory:   Intact  Fund of knowledge:    Consistent with age and development  Insight:     Good  Judgment:    Good  Impulse Control:   Good     Reported Symptoms: Anxiety, obsessive thinking, sadness, rumination, challenges with motivation  Risk Assessment: Danger to Self:  No Self-injurious Behavior: No Danger to Others: No Duty to Warn:no Physical Aggression / Violence:No  Access to Firearms a concern: No  Gang Involvement:No  Patient / guardian was educated about steps to take if suicide or homicide risk level increases between visits: yes While future psychiatric events cannot be accurately predicted, the patient does not currently require acute inpatient psychiatric care and does not currently meet [facility]  involuntary commitment criteria.  Subjective: Patient presented for session on time.  He shared how he had a pleasant trip visiting family in [place].  He stated this is his first trip to see family and friends in that area in the past 3 years, his first time going completely alone.  He went on to share it being a positive experience, his feeling of the connection to family persist there.  He went on to share how he has felt less so with his family living here in the [place] .  He went on to share challenges in relationships over the years, more history related to his  decisions when he was younger during adolescence.   He identified some sad feelings related to his father's lack of involvement at times throughout his life; he stated this continues to persist in some ways currently.  He considers the idea of communicating his feelings more openly but is uncertain if he wants to pursue this at this point.  Patient plans to reflect on this between sessions. He stated that he and his wife are close to finalizing their divorce; however he continues to adjust to this change was explored.    Interventions: Supportive therapy, motivational interviewing, CBT   Diagnoses:    ICD-10-CM   1. Depression, major, recurrent, mild (HCC)  F33.0          Plan: Patient is to use coping as identified in session, utilize his support system, continue to practice self-care and stress reduction.   Long-term goal: Improve management of distressful emotions including his anxiety.        Improve coping and identification and processing of feelings to improve coping with stress.  Short-term goal: To identify and process feelings related to recent stressors, primarily marital.                   Continue to work to be mindful of thoughts associated with increasing his anxiety and work to reframe them and use other coping skill  as identified in session.                   Follow through with self-care such as his continuing to exercise and get enough rest.                                Assessment of progress:  progressing     Lonni Fischer, [facility]

## 2024-06-01 ENCOUNTER — Ambulatory Visit: Admitting: Mental Health

## 2024-06-01 NOTE — Progress Notes (Signed)
 Error

## 2024-06-07 ENCOUNTER — Ambulatory Visit (INDEPENDENT_AMBULATORY_CARE_PROVIDER_SITE_OTHER): Admitting: Mental Health

## 2024-06-07 DIAGNOSIS — F330 Major depressive disorder, recurrent, mild: Secondary | ICD-10-CM | POA: Diagnosis not present

## 2024-06-07 NOTE — Progress Notes (Signed)
 [facility] psychotherapy note Name: Brandon White Date:  06/07/24 MRN: [id number] DOB: 1991/05/23 PCP: Seabron Lenis, MD  Time spent: 51 minutes Time start:   1:00pm    time out: 1:51 pm  Treatment: Individual therapy     Mental Status Exam:    Appearance:    Casual     Behavior:   Appropriate  Motor:   WNL  Speech/Language:    Clear and Coherent  Affect:   Full range   Mood:   Sad, pleasant  Thought process:   Logical, linear, goal directed  Thought content:     WNL  Sensory/Perceptual disturbances:     none  Orientation:   x4  Attention:   Good  Concentration:   Good  Memory:   Intact  Fund of knowledge:    Consistent with age and development  Insight:     Good  Judgment:    Good  Impulse Control:   Good     Reported Symptoms: Anxiety, obsessive thinking, sadness, rumination, challenges with motivation  Risk Assessment: Danger to Self:  No Self-injurious Behavior: No Danger to Others: No Duty to Warn:no Physical Aggression / Violence:No  Access to Firearms a concern: No  Gang Involvement:No  Patient / guardian was educated about steps to take if suicide or homicide risk level increases between visits: yes While future psychiatric events cannot be accurately predicted, the patient does not currently require acute inpatient psychiatric care and does not currently meet [facility]  involuntary commitment criteria.  Subjective: Patient presented for session on time .  Assessed progress where he shared recent challenges with relationships.  He shared some details related to the ongoing challenges navigating his marital separation.  He stated he consulted with his attorney and discontinued making [employer]  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  He stated later, while still there, his wife then arrived to confront him, he stated that eventually was able to  leave.  Other concerns related to his teenage daughter was shared, identified the need to communicate with her therapist to continue to provide support and obtain guidance.  Facilitated his processing recent events, feelings related.  He continues to remain focused on providing support to his children while also trying to manage work demands.  Explored collaboratively ways he can effectively communicate in his relationships, while providing support, understanding.    Interventions: Supportive therapy, motivational interviewing, CBT   Diagnoses:  No diagnosis found.      Plan: Patient is to use coping as identified in session, utilize his support system, continue to practice self-care and stress reduction.   Long-term goal: Improve management of distressful emotions including his anxiety.        Improve coping and identification and processing of feelings to improve coping with stress.  Short-term goal: To identify and process feelings related to recent stressors, primarily marital.                   Continue to work to be mindful of thoughts associated with increasing his anxiety and work to reframe them and use other coping skill                              as identified in session.                   Follow  through with self-care such as his continuing to exercise and get enough rest.                                Assessment of progress:  progressing     Lonni Fischer, [facility]

## 2024-06-14 ENCOUNTER — Ambulatory Visit: Admitting: Mental Health

## 2024-06-14 DIAGNOSIS — F330 Major depressive disorder, recurrent, mild: Secondary | ICD-10-CM

## 2024-06-14 NOTE — Progress Notes (Signed)
 [facility] psychotherapy note Name: Brandon White Date:  06/14/24 MRN: [id number] DOB: Oct 30, 1991 PCP: Seabron Lenis, MD  Time spent: 50 minutes Time start: 11: 00 a.m. time out: 11: 5 0 a.m.  Treatment: Individual therapy     Mental Status Exam:    Appearance:    Casual     Behavior:   Appropriate  Motor:   WNL  Speech/Language:    Clear and Coherent  Affect:   Full range   Mood:   Sad, pleasant  Thought process:   Logical, linear, goal directed  Thought content:     WNL  Sensory/Perceptual disturbances:     none  Orientation:   x4  Attention:   Good  Concentration:   Good  Memory:   Intact  Fund of knowledge:    Consistent with age and development  Insight:     Good  Judgment:    Good  Impulse Control:   Good     Reported Symptoms: Anxiety, obsessive thinking, sadness, rumination, challenges with motivation  Risk Assessment: Danger to Self:  No Self-injurious Behavior: No Danger to Others: No Duty to Warn:no Physical Aggression / Violence:No  Access to Firearms a concern: No  Gang Involvement:No  Patient / guardian was educated about steps to take if suicide or homicide risk level increases between visits: yes While future psychiatric events cannot be accurately predicted, the patient does not currently require acute inpatient psychiatric care and does not currently meet [place]  involuntary commitment criteria.  Subjective: Patient presented for session on time.  Assessed progress.  Patient shared how he has been very busy, keeping up with work full-time, caring for his children and navigating relationships.  He stated that he continues to have concerns about his oldest daughter as she has had more stress recently.  He went on to share a discussion they have and how he continues to be supportive emotionally while also trying to encourage her to spend time with the family.  He shared ongoing communication he has with his wife, they remain separated  and move toward divorce.  He continues to be focused on his children and their wellbeing, his youngest son needing a surgery in the coming weeks.  He continues to be financially supportive as well.  Some stress in the relationship with his girlfriend going on to share some recent experiences.  Ways he continues to try and cope and care for himself by engaging in exercise continues to be his focus.  Sleep has been less consistent recently and this was encouraged when possible as some of this is unavoidable at times due to if his daughter wakes at night.  Continue to facilitate his identifying, processing feelings, framing needs.   Interventions: Supportive therapy, motivational interviewing, CBT   Diagnoses:    ICD-10-CM   1. Depression, major, recurrent, mild (HCC)  F33.0         Plan: Patient is to use coping as identified in session, utilize his support system, continue to practice self-care and stress reduction.   Long-term goal: Improve management of distressful emotions including his anxiety.        Improve coping and identification and processing of feelings to improve coping with stress.  Short-term goal: To identify and process feelings related to recent stressors, primarily marital.                   Continue to work to be mindful of thoughts associated with increasing his anxiety and work to reframe them  and use other coping skill                              as identified in session.                   Follow through with self-care such as his continuing to exercise and get enough rest.                                Assessment of progress:  progressing     Lonni Fischer, [facility]

## 2024-06-28 ENCOUNTER — Ambulatory Visit: Admitting: Mental Health

## 2024-06-28 NOTE — Progress Notes (Unsigned)
 [facility] psychotherapy note Name: Brandon White Date:  06/28/24 MRN: [id number] DOB: 07-Jul-1991 PCP: Seabron Lenis, MD  Time spent:  49 minutes Time start: 10: 08 a.m. time out: 10:55 a.m.  Treatment: Individual therapy     Mental Status Exam:    Appearance:    Casual     Behavior:   Appropriate  Motor:   WNL  Speech/Language:    Clear and Coherent  Affect:   Full range   Mood:   Sad, pleasant  Thought process:   Logical, linear, goal directed  Thought content:     WNL  Sensory/Perceptual disturbances:     none  Orientation:   x4  Attention:   Good  Concentration:   Good  Memory:   Intact  Fund of knowledge:    Consistent with age and development  Insight:     Good  Judgment:    Good  Impulse Control:   Good     Reported Symptoms: Anxiety, obsessive thinking, sadness, rumination, challenges with motivation  Risk Assessment: Danger to Self:  No Self-injurious Behavior: No Danger to Others: No Duty to Warn:no Physical Aggression / Violence:No  Access to Firearms a concern: No  Gang Involvement:No  Patient / guardian was educated about steps to take if suicide or homicide risk level increases between visits: yes While future psychiatric events cannot be accurately predicted, the patient does not currently require acute inpatient psychiatric care and does not currently meet [place]  involuntary commitment criteria.  Subjective: Patient presented for session on time.  Assessed progress.  Patient shared how he has been very busy, keeping up with work full-time, caring for his children and navigating relationships.  He stated that he continues to have concerns about his oldest daughter as she has had more stress recently.  He went on to share a discussion they have and how he continues to be supportive emotionally while also trying to encourage her to spend time with the family.  He shared ongoing communication he has with his wife, they remain separated  and move toward divorce.  He continues to be focused on his children and their wellbeing, his youngest son needing a surgery in the coming weeks.  He continues to be financially supportive as well.  Some stress in the relationship with his girlfriend going on to share some recent experiences.  Ways he continues to try and cope and care for himself by engaging in exercise continues to be his focus.  Sleep has been less consistent recently and this was encouraged when possible as some of this is unavoidable at times due to if his daughter wakes at night.  Continue to facilitate his identifying, processing feelings, framing needs.   Interventions: Supportive therapy, motivational interviewing, CBT   Diagnoses:  No diagnosis found.     Plan: Patient is to use coping as identified in session, utilize his support system, continue to practice self-care and stress reduction.   Long-term goal: Improve management of distressful emotions including his anxiety.        Improve coping and identification and processing of feelings to improve coping with stress.  Short-term goal: To identify and process feelings related to recent stressors, primarily marital.                   Continue to work to be mindful of thoughts associated with increasing his anxiety and work to reframe them and use other coping skill  as identified in session.                   Follow through with self-care such as his continuing to exercise and get enough rest.                                Assessment of progress:  progressing     Lonni Fischer, [facility]

## 2024-06-30 ENCOUNTER — Ambulatory Visit: Admitting: Mental Health

## 2024-07-06 ENCOUNTER — Ambulatory Visit (INDEPENDENT_AMBULATORY_CARE_PROVIDER_SITE_OTHER): Admitting: Mental Health

## 2024-07-06 DIAGNOSIS — F330 Major depressive disorder, recurrent, mild: Secondary | ICD-10-CM

## 2024-07-06 NOTE — Progress Notes (Signed)
 [facility] psychotherapy note Name: Brandon White Date:  07/06/24 MRN: [id number] DOB: 06-Jul-1991 PCP: Seabron Lenis, MD  Time spent:  55 minutes Time start: 11:00 a.m. time out: 11:55 a.m.  Treatment: Individual therapy  Virtual Visit via Telehealth Note Connected with patient by a telemedicine/telehealth application, with their informed consent, and verified patient privacy and that I am speaking with the correct person using two identifiers. I discussed the limitations, risks, security and privacy concerns of performing psychotherapy and the availability of in person appointments. I also discussed with the patient that there may be a patient responsible charge related to this service. The patient expressed understanding and agreed to proceed. I discussed the treatment planning with the patient. The patient was provided an opportunity to ask questions and all were answered. The patient agreed with the plan and demonstrated an understanding of the instructions. The patient was advised to call  our office if  symptoms worsen or feel they are in a crisis state and need immediate contact.   Therapist Location: office Patient Location: home     Mental Status Exam:    Appearance:    Casual     Behavior:   Appropriate  Motor:   WNL  Speech/Language:    Clear and Coherent  Affect:   Full range   Mood:   Sad, pleasant  Thought process:   Logical, linear, goal directed  Thought content:     WNL  Sensory/Perceptual disturbances:     none  Orientation:   x4  Attention:   Good  Concentration:   Good  Memory:   Intact  Fund of knowledge:    Consistent with age and development  Insight:     Good  Judgment:    Good  Impulse Control:   Good     Reported Symptoms: Anxiety, obsessive thinking, sadness, rumination, challenges with motivation  Risk Assessment: Danger to Self:  No Self-injurious Behavior: No Danger to Others: No Duty to Warn:no Physical Aggression /  Violence:No  Access to Firearms a concern: No  Gang Involvement:No  Patient / guardian was educated about steps to take if suicide or homicide risk level increases between visits: yes While future psychiatric events cannot be accurately predicted, the patient does not currently require acute inpatient psychiatric care and does not currently meet [place]  involuntary commitment criteria.  Subjective: Patient engaged in telehealth session via video.  Assessed progress where he shared progress related to he and his wife moving toward finalizing details regarding their separation and eventual divorce.  He went on to share challenges in the relationship with his girlfriend, some disagreements they had recently where he shared details.  At this point he expresses wanting the relationship to work and they are working through some of their issues.  He is hopeful about moving forward with the divorce from his wife, future plans with his girlfriend were shared.  Facilitated his identifying needs where he identified wanting to have more time with his children, barriers such as his work hours and not seeing them as often since the separation as contributing factors.  Ways to cope and care for himself were explored where he continues to struggle getting consistent sleep, reports last night not sleeping due to stress.  Other outlets, such as exercise are activities he continues to engage.   Interventions: Supportive therapy, motivational interviewing, CBT   Diagnoses:    ICD-10-CM   1. Depression, major, recurrent, mild (HCC)  F33.0  Plan: Patient is to use coping as identified in session, utilize his support system, continue to practice self-care and stress reduction.   Long-term goal: Improve management of distressful emotions including his anxiety.        Improve coping and identification and processing of feelings to improve coping with stress.  Short-term goal: To identify and process  feelings related to recent stressors, primarily marital.                   Continue to work to be mindful of thoughts associated with increasing his anxiety and work to reframe them and use other coping skill                              as identified in session.                   Follow through with self-care such as his continuing to exercise and get enough rest.                                Assessment of progress:  progressing     Lonni Fischer, [facility]

## 2024-07-12 ENCOUNTER — Ambulatory Visit: Admitting: Mental Health

## 2024-07-16 ENCOUNTER — Ambulatory Visit: Admitting: Mental Health

## 2024-07-16 DIAGNOSIS — F330 Major depressive disorder, recurrent, mild: Secondary | ICD-10-CM

## 2024-07-16 NOTE — Progress Notes (Signed)
 [facility] psychotherapy note Name: Brandon White Date:  07/16/24 MRN: [id number] DOB: [birth year] PCP: Seabron Lenis, MD  Time spent:  51 minutes Time start: 10: 00 a.m. time out: 10: 51 AM  Treatment: Individual therapy     Mental Status Exam:    Appearance:    Casual     Behavior:   Appropriate  Motor:   WNL  Speech/Language:    Clear and Coherent  Affect:   Full range   Mood:   Sad, pleasant  Thought process:   Logical, linear, goal directed  Thought content:     WNL  Sensory/Perceptual disturbances:     none  Orientation:   x4  Attention:   Good  Concentration:   Good  Memory:   Intact  Fund of knowledge:    Consistent with age and development  Insight:     Good  Judgment:    Good  Impulse Control:   Good     Reported Symptoms: Anxiety, obsessive thinking, sadness, rumination, challenges with motivation  Risk Assessment: Danger to Self:  No Self-injurious Behavior: No Danger to Others: No Duty to Warn:no Physical Aggression / Violence:No  Access to Firearms a concern: No  Gang Involvement:No  Patient / guardian was educated about steps to take if suicide or homicide risk level increases between visits: yes While future psychiatric events cannot be accurately predicted, the patient does not currently require acute inpatient psychiatric care and does not currently meet [place]  involuntary commitment criteria.  Subjective:  Assessed progress for patient shared how he continues to feel stress and reports his mood as depressed.  He stated that he continues to try to keep up with work demands while also navigating relationships with his children and girlfriend.  He stated that he and his ex-wife continue to move through the separation process and he awaits to hear back from his attorney.  He went on to share challenges in relationship with his girlfriend, his valuing the time he has with his children while trying to strike a balance with how often he  is around his girlfriend's daughter.  He shared how at times that can interfere with all of the responsibilities he has, his further identifying how he feels a sense of pressure in these different areas of his life ranging from financial to family and with the relationship with his girlfriend.  He identified wanting some aspects of his responsibilities validated, understood and feels that this has been a difficulty for he and his girlfriend over the last month.  At this point he identified uncertainty about the relationship.  He stated that he continues to exercise but struggles to get sleep consistently or maintain an adequate diet.  Provide support and encouragement toward self-care, getting proper rest and eating meals where he states that he is more motivated to eat knowing that it will benefit his workouts.  Reviewed how this can also impact his mood.   Interventions: Supportive therapy, motivational interviewing, CBT   Diagnoses:    ICD-10-CM   1. Depression, major, recurrent, mild (HCC)  F33.0         Plan: Patient is to use coping as identified in session, utilize his support system, continue to practice self-care and stress reduction.   Long-term goal: Improve management of distressful emotions including his anxiety.        Improve coping and identification and processing of feelings to improve coping with stress.  Short-term goal: To identify and process feelings related to recent stressors,  primarily marital.                   Continue to work to be mindful of thoughts associated with increasing his anxiety and work to reframe them and use other coping skill                              as identified in session.                   Follow through with self-care such as his continuing to exercise and get enough rest.                                Assessment of progress:  progressing     Lonni Fischer, [facility]

## 2024-07-23 ENCOUNTER — Ambulatory Visit: Admitting: Mental Health

## 2024-07-23 DIAGNOSIS — F330 Major depressive disorder, recurrent, mild: Secondary | ICD-10-CM

## 2024-07-23 NOTE — Progress Notes (Unsigned)
 [facility] psychotherapy note Name: Naod Sweetland Date:  07/23/24 MRN: [id number] DOB: 1991/04/11 PCP: Seabron Lenis, MD  Time spent:  51 minutes Time start: 10: 00 a.m. time out: 10: 51 AM  Treatment: Individual therapy     Mental Status Exam:    Appearance:    Casual     Behavior:   Appropriate  Motor:   WNL  Speech/Language:    Clear and Coherent  Affect:   Full range   Mood:   Sad, pleasant  Thought process:   Logical, linear, goal directed  Thought content:     WNL  Sensory/Perceptual disturbances:     none  Orientation:   x4  Attention:   Good  Concentration:   Good  Memory:   Intact  Fund of knowledge:    Consistent with age and development  Insight:     Good  Judgment:    Good  Impulse Control:   Good     Reported Symptoms: Anxiety, obsessive thinking, sadness, rumination, challenges with motivation  Risk Assessment: Danger to Self:  No Self-injurious Behavior: No Danger to Others: No Duty to Warn:no Physical Aggression / Violence:No  Access to Firearms a concern: No  Gang Involvement:No  Patient / guardian was educated about steps to take if suicide or homicide risk level increases between visits: yes While future psychiatric events cannot be accurately predicted, the patient does not currently require acute inpatient psychiatric care and does not currently meet [place]  involuntary commitment criteria.  Subjective: Patient arrived on time for today's session.  He shared ongoing struggles in the relationship with his girlfriend.  Time spent for him to provide more details, challenges and he identified needs toward continuing to work on wanting their communication to improve.  He shared challenges in his past marital relationships, his wanting to have more time with his children although he tries to balance his full-time job, seeing his biological children every other week and also trying to make time for his girlfriend and her child who reside  with him over the past few months.  Through further guided discovery, he identified some levels of resentment due to the lack of time he has with his own children when considering how much time his girlfriend has with hers as well as his ex-wife has with their own children as she is not working.  Ways to communicate in their relationship were explored as this was identified as a need.  He continues to try and make time for self-care, working out when possible although he reports his appetite has diminished to distress.  Provide support and encouragement for him to focus on his self-care which will ready him to improve his coping with life stressors and assist with his mood.   Interventions: Supportive therapy, motivational interviewing, CBT   Diagnoses:    ICD-10-CM   1. Depression, major, recurrent, mild (HCC)  F33.0          Plan: Patient is to use coping as identified in session, utilize his support system, continue to practice self-care and stress reduction.   Long-term goal: Improve management of distressful emotions including his anxiety.        Improve coping and identification and processing of feelings to improve coping with stress.  Short-term goal: To identify and process feelings related to recent stressors, primarily marital.                   Continue to work to be mindful of thoughts associated with  increasing his anxiety and work to reframe them and use other coping skill                              as identified in session.                   Follow through with self-care such as his continuing to exercise and get enough rest.                                Assessment of progress:  progressing     Lonni Fischer, [facility]

## 2024-07-26 ENCOUNTER — Ambulatory Visit (INDEPENDENT_AMBULATORY_CARE_PROVIDER_SITE_OTHER): Admitting: Mental Health

## 2024-07-26 DIAGNOSIS — F428 Other obsessive-compulsive disorder: Secondary | ICD-10-CM | POA: Diagnosis not present

## 2024-07-26 DIAGNOSIS — F330 Major depressive disorder, recurrent, mild: Secondary | ICD-10-CM

## 2024-07-26 NOTE — Progress Notes (Signed)
 [facility] psychotherapy note Name: Brandon White Date:  07/26/24 MRN: [id number] DOB: [birth year] PCP: Seabron Lenis, MD  Time spent:  52 minutes Time start: 11: 05 AM time out: 11: 57 AM  Treatment: Individual therapy     Mental Status Exam:    Appearance:    Casual     Behavior:   Appropriate  Motor:   WNL  Speech/Language:    Clear and Coherent  Affect:   Full range   Mood:   Sad, pleasant  Thought process:   Logical, linear, goal directed  Thought content:     WNL  Sensory/Perceptual disturbances:     none  Orientation:   x4  Attention:   Good  Concentration:   Good  Memory:   Intact  Fund of knowledge:    Consistent with age and development  Insight:     Good  Judgment:    Good  Impulse Control:   Good     Reported Symptoms: Anxiety, obsessive thinking, sadness, rumination, challenges with motivation  Risk Assessment: Danger to Self:  No Self-injurious Behavior: No Danger to Others: No Duty to Warn:no Physical Aggression / Violence:No  Access to Firearms a concern: No  Gang Involvement:No  Patient / guardian was educated about steps to take if suicide or homicide risk level increases between visits: yes While future psychiatric events cannot be accurately predicted, the patient does not currently require acute inpatient psychiatric care and does not currently meet [place]  involuntary commitment criteria.  Subjective:  Patient arrived on time for today's session.  Assessed progress.  Patient shared how he continues to adjust to the break-up with his girlfriend about 2 weeks ago.  He shared how he had his kids over the past week sharing experiences.  He shared how he misses his girlfriend although he shared perspectives he is getting from having more time to reflect after their break-up.  He mentioned many positive things she brought to the relationship however also is feeling a sense of pressure, such as they are having a child soon.  Identified  feelings of inadequacy, considers some aspects of himself to have a low self-esteem and is needing more self-confidence.  He identified his difficulty excepting complements which he feels is related to his feelings of insecurity regarding his self-worth and some aspects.  He shared recent job injuries and we encouraged him to continue to journal with some focus on feelings of inadequacy that he carries with him as he identified issues from the past in childhood and recently.    Interventions: Supportive therapy, motivational interviewing, CBT   Diagnoses:    ICD-10-CM   1. Depression, major, recurrent, mild (HCC)  F33.0     2. 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   Long-term goal: Improve management of distressful emotions including his anxiety.        Improve coping and identification and processing of feelings to improve coping with stress.  Short-term goal: To identify and process feelings related to recent stressors.                   Continue to work to be mindful of thoughts associated with increasing his anxiety and work to reframe them and use other coping skill  as identified in session.                   Follow through with self-care such as his continuing to exercise and get adequate rest.                                Assessment of progress:  progressing     Lonni Fischer, [facility]

## 2024-07-30 ENCOUNTER — Ambulatory Visit: Admitting: Mental Health

## 2024-07-30 DIAGNOSIS — F330 Major depressive disorder, recurrent, mild: Secondary | ICD-10-CM | POA: Diagnosis not present

## 2024-07-30 NOTE — Progress Notes (Signed)
 [facility] psychotherapy note Name: Brandon White Date:  07/30/24 MRN: [id number] DOB: Sep 20, 1991 PCP: Seabron Lenis, MD  Time spent:  50 minutes Time start: 10: 00 AM time out: 10:50 AM  Treatment: Individual therapy     Mental Status Exam:    Appearance:    Casual     Behavior:   Appropriate  Motor:   WNL  Speech/Language:    Clear and Coherent  Affect:   Full range   Mood:   Sad, pleasant  Thought process:   Logical, linear, goal directed  Thought content:     WNL  Sensory/Perceptual disturbances:     none  Orientation:   x4  Attention:   Good  Concentration:   Good  Memory:   Intact  Fund of knowledge:    Consistent with age and development  Insight:     Good  Judgment:    Good  Impulse Control:   Good     Reported Symptoms: Anxiety, obsessive thinking, sadness, rumination, challenges with motivation  Risk Assessment: Danger to Self:  No Self-injurious Behavior: No Danger to Others: No Duty to Warn:no Physical Aggression / Violence:No  Access to Firearms a concern: No  Gang Involvement:No  Patient / guardian was educated about steps to take if suicide or homicide risk level increases between visits: yes While future psychiatric events cannot be accurately predicted, the patient does not currently require acute inpatient psychiatric care and does not currently meet [place]  involuntary commitment criteria.  Subjective:  Patient arrived on time for today's session.  Patient shared how he had a challenging week, he stated that last night he did not sleep well due to an individual trespassing on his property.  He stated that he confronted him and this individual left abruptly.  He stated that he is considering making a police report.  He stated that he does not have the kids this week as well as this weekend, plans to stay busy by engaging in some work related to his job.  Challenges being alone in the house were shared as this is a new experience for  him due to the events recently where his girlfriend had left with her 2-year-old daughter after their break-up.  He stated that he and his ex-girlfriend continue to communicate via text.  He identified the need to maintain some boundaries interpersonally where he shared some journal entries related to not focusing on ways to get her back, rather work on himself toward meeting his own needs.   Interventions: Supportive therapy, motivational interviewing, CBT   Diagnoses:    ICD-10-CM   1. Depression, major, recurrent, mild (HCC)  F33.0           Plan: Patient is to use coping as identified in session, utilize his support system, continue to practice self-care and stress reduction.   Long-term goal: Improve management of distressful emotions including his anxiety.        Improve coping and identification and processing of feelings to improve coping with stress.  Short-term goal: To identify and process feelings related to recent stressors.                   Continue to work to be mindful of thoughts associated with increasing his anxiety and work to reframe them and use other coping skill                              as  identified in session.                   Follow through with self-care such as his continuing to exercise and get adequate rest.                                Assessment of progress:  progressing     Lonni Fischer, [facility]

## 2024-08-03 ENCOUNTER — Ambulatory Visit (INDEPENDENT_AMBULATORY_CARE_PROVIDER_SITE_OTHER): Admitting: Mental Health

## 2024-08-03 DIAGNOSIS — F330 Major depressive disorder, recurrent, mild: Secondary | ICD-10-CM | POA: Diagnosis not present

## 2024-08-03 NOTE — Progress Notes (Signed)
 [facility] psychotherapy note Name: Brandon White Date:  08/03/24 MRN: [id number] DOB: 12-Jul-1991 PCP: Seabron Lenis, MD  Time spent:  51 minutes Time start: 11: 00 AM time out: 11:51 AM  Treatment: Individual therapy     Mental Status Exam:    Appearance:    Casual     Behavior:   Appropriate  Motor:   WNL  Speech/Language:    Clear and Coherent  Affect:   Full range   Mood:   euthymic, pleasant  Thought process:   Logical, linear, goal directed  Thought content:     WNL  Sensory/Perceptual disturbances:     none  Orientation:   x4  Attention:   Good  Concentration:   Good  Memory:   Intact  Fund of knowledge:    Consistent with age and development  Insight:     Good  Judgment:    Good  Impulse Control:   Good     Reported Symptoms: Anxiety, obsessive thinking, sadness, rumination, challenges with motivation  Risk Assessment: Danger to Self:  No Self-injurious Behavior: No Danger to Others: No Duty to Warn:no Physical Aggression / Violence:No  Access to Firearms a concern: No  Gang Involvement:No  Patient / guardian was educated about steps to take if suicide or homicide risk level increases between visits: yes While future psychiatric events cannot be accurately predicted, the patient does not currently require acute inpatient psychiatric care and does not currently meet [place]  involuntary commitment criteria.  Subjective:  Patient arrived on time for today's session.  Patient shared recent events, changes.  He stated that he was able to spend time with his ex-girlfriend over the past weekend.  He identified feelings of contentment and how the experience was meaningful due to their being able to be around 1 another with no pressure, although he stated that they were also able to discuss some of their relationship issues.  At this point, he would like to restart the relationship but recognizes some of the needs she is identified for herself.   Facilitated his sharing as well as identifying his own needs as discussed in previous sessions.  He continues to express some hopefulness and optimism about the possibility of their relationship continuing to develop, recognizing some specific challenges that they have acknowledged specifically.  He wants her to have a sense of independence through her own employment.  This, along with other needs identified, would give him a sense of reassurance and validation.      Interventions: Supportive therapy, motivational interviewing, CBT   Diagnoses:    ICD-10-CM   1. Depression, major, recurrent, mild (HCC)  F33.0            Plan: Patient is to use coping as identified in session, utilize his support system, continue to practice self-care and stress reduction.   Long-term goal: Improve management of distressful emotions including his anxiety.        Improve coping and identification and processing of feelings to improve coping with stress.  Short-term goal: To identify and process feelings related to recent stressors.                   Continue to work to be mindful of thoughts associated with increasing his anxiety and work to reframe them and use other coping skill                              as  identified in session.                   Follow through with self-care such as his continuing to exercise and get adequate rest.                                Assessment of progress:  progressing     Lonni Fischer, [facility]

## 2024-08-09 ENCOUNTER — Ambulatory Visit (INDEPENDENT_AMBULATORY_CARE_PROVIDER_SITE_OTHER): Admitting: Mental Health

## 2024-08-09 DIAGNOSIS — F330 Major depressive disorder, recurrent, mild: Secondary | ICD-10-CM

## 2024-08-09 NOTE — Progress Notes (Signed)
 [facility] psychotherapy note Name: Inri Sobieski Date:  08/09/24 MRN: [id number] DOB: August 08, 1991 PCP: Seabron Lenis, MD  Time spent:  51 minutes Time start: 11: 00 AM time out: 11:51 AM  Treatment: Individual therapy     Mental Status Exam:    Appearance:    Casual     Behavior:   Appropriate  Motor:   WNL  Speech/Language:    Clear and Coherent  Affect:   Full range   Mood:   Sad, pleasant  Thought process:   Logical, linear, goal directed  Thought content:     WNL  Sensory/Perceptual disturbances:     none  Orientation:   x4  Attention:   Good  Concentration:   Good  Memory:   Intact  Fund of knowledge:    Consistent with age and development  Insight:     Good  Judgment:    Good  Impulse Control:   Good     Reported Symptoms: Anxiety, obsessive thinking, sadness, rumination, challenges with motivation  Risk Assessment: Danger to Self:  No Self-injurious Behavior: No Danger to Others: No Duty to Warn:no Physical Aggression / Violence:No  Access to Firearms a concern: No  Gang Involvement:No  Patient / guardian was educated about steps to take if suicide or homicide risk level increases between visits: yes While future psychiatric events cannot be accurately predicted, the patient does not currently require acute inpatient psychiatric care and does not currently meet   involuntary commitment criteria.  Subjective:  Patient arrived on time for today's session.  Assessed progress per patient shared how he is coping with the break-up with his girlfriend.  He stated although they had spent time together recently, he stated the following days it was reaffirmed by her that the relationship needs to and.  He went on to share how he feels conflicted, would like the relationship to continue but also identified how there were challenges that were significant.  Through further guided discovery, he identified how he has feelings of inadequacy, further  sharing how he values companionship and one way he defines himself is by caring for others by taking care of them particularly in a romantic partner relationship.  Ways to cope and care for himself were explored where he makes attempts to spend time with friends although he understands they are busy, some of them married with children.  Additionally, how this is impacted his children, most notably his teenage daughter as she had been attached to his ex-girlfriend.  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    Interventions: Supportive therapy, motivational interviewing, CBT   Diagnoses:    ICD-10-CM   1. Depression, major, recurrent, mild (HCC)  F33.0            Plan: Patient is to use coping as identified in session, utilize his support system, continue to practice self-care and stress reduction.   Long-term goal: Improve management of distressful emotions including his anxiety.        Improve coping and identification and processing of feelings to improve coping with stress.  Short-term goal: To identify and process feelings related to recent stressors.                   Continue to work to be mindful of thoughts associated with increasing his anxiety and work to reframe them and  use other coping skill                              as identified in session.                   Follow through with self-care such as his continuing to exercise and get adequate rest.                                Assessment of progress:  progressing     Lonni Fischer, [facility]

## 2024-08-16 ENCOUNTER — Ambulatory Visit: Admitting: Mental Health

## 2024-08-23 ENCOUNTER — Ambulatory Visit: Admitting: Mental Health

## 2024-08-23 DIAGNOSIS — F330 Major depressive disorder, recurrent, mild: Secondary | ICD-10-CM

## 2024-08-23 NOTE — Progress Notes (Signed)
 [facility] psychotherapy note Name: Brandon White Date: 08/23/24 MRN: [id number] DOB: 07-Jun-1991 PCP: Seabron Lenis, MD  Time spent:  50 minutes Time start: 2:05pM time out: 2:55pm   Treatment: Individual therapy     Mental Status Exam:    Appearance:    Casual     Behavior:   Appropriate  Motor:   WNL  Speech/Language:    Clear and Coherent  Affect:   Full range   Mood:   Sad, pleasant  Thought process:   Logical, linear, goal directed  Thought content:     WNL  Sensory/Perceptual disturbances:     none  Orientation:   x4  Attention:   Good  Concentration:   Good  Memory:   Intact  Fund of knowledge:    Consistent with age and development  Insight:     Good  Judgment:    Good  Impulse Control:   Good     Reported Symptoms: Anxiety, obsessive thinking, sadness, rumination, challenges with motivation  Risk Assessment: Danger to Self:  No Self-injurious Behavior: No Danger to Others: No Duty to Warn:no Physical Aggression / Violence:No  Access to Firearms a concern: No  Gang Involvement:No  Patient / guardian was educated about steps to take if suicide or homicide risk level increases between visits: yes While future psychiatric events cannot be accurately predicted, the patient does not currently require acute inpatient psychiatric care and does not currently meet [place]  involuntary commitment criteria.  Subjective:  Patient arrived on time for today's session.  Assessed progress where patient shared he has had a difficult 2 weeks.  He went on to share how he and his ex-girlfriend continue to communicate.  He provided many details, experiences that have been making him feel more stressed, continues to feel depressed.  He stated that they were communicating with some regularity until recent changes.  He stated that he inadvertently showed up her daughter's birthday party he stated this was purely constant now as he was meeting his sister there who  also has children.  He stated that his ex-girlfriend was upset, how she did not speak to his children.  The next day after experiencing sadness and crying spells, he shared how he experienced a change where he feels ready to move on from the relationship.  He went on to share further ways he has framed thoughts toward moving forward for himself.  Provide support and encouragement for his clarifying his needs, reasons he feels this changes needed for himself and what he has learned about himself through this relationship.  He wants to continue to work on himself as he recognizes some anxious attachment style characteristics he has not relationships.    Interventions: Supportive therapy, motivational interviewing, CBT   Diagnoses:    ICD-10-CM   1. Depression, major, recurrent, mild  F33.0             Plan: Patient is to use coping as identified in session, utilize his support system, continue to practice self-care and stress reduction.   Long-term goal: Improve management of distressful emotions including his anxiety.        Improve coping and identification and processing of feelings to improve coping with stress.  Short-term goal: To identify and process feelings related to recent stressors.                   Continue to work to be mindful of thoughts associated with increasing his anxiety and work to reframe them and  use other coping skill                              as identified in session.                   Follow through with self-care such as his continuing to exercise and get adequate rest.                                Assessment of progress:  progressing     Lonni Fischer, [facility]

## 2024-08-25 ENCOUNTER — Ambulatory Visit: Admitting: Mental Health

## 2024-09-03 ENCOUNTER — Ambulatory Visit: Admitting: Mental Health

## 2024-09-03 DIAGNOSIS — F330 Major depressive disorder, recurrent, mild: Secondary | ICD-10-CM

## 2024-09-03 NOTE — Progress Notes (Unsigned)
 [facility] psychotherapy note Name: Kevron Patella Date: 09/03/24 MRN: [id number] DOB: [birth year] PCP: Seabron Lenis, MD  Time spent:  51 minutes Time start: 11 AM time out 11: 5 1 AM  Treatment: Individual therapy     Mental Status Exam:    Appearance:    Casual     Behavior:   Appropriate  Motor:   WNL  Speech/Language:    Clear and Coherent  Affect:   Full range   Mood:   Sad, pleasant  Thought process:   Logical, linear, goal directed  Thought content:     WNL  Sensory/Perceptual disturbances:     none  Orientation:   x4  Attention:   Good  Concentration:   Good  Memory:   Intact  Fund of knowledge:    Consistent with age and development  Insight:     Good  Judgment:    Good  Impulse Control:   Good     Reported Symptoms: Anxiety, obsessive thinking, sadness, rumination, challenges with motivation  Risk Assessment: Danger to Self:  No Self-injurious Behavior: No Danger to Others: No Duty to Warn:no Physical Aggression / Violence:No  Access to Firearms a concern: No  Gang Involvement:No  Patient / guardian was educated about steps to take if suicide or homicide risk level increases between visits: yes While future psychiatric events cannot be accurately predicted, the patient does not currently require acute inpatient psychiatric care and does not currently meet [place]  involuntary commitment criteria.  Subjective:  Patient arrived on time for today's session.  Patient shared recent events how he continues to adjust to the break-up with his girlfriend.  He stated that he did not communicate with her again for about 2 weeks after the last situation where he presented himself at her daughter's birthday party, unexpectedly as he did not plan to be present.  He stated that he continues to have feelings for her but also verbalizes the need to give himself time to focus on what he needs for himself given the circumstances of how their relationship ended  and other complexities that persisted during the relationship.  He stated they were able to talk yesterday and felt the discussion went well.  Facilitated his identifying needs where he plans to maintain some communication with her but also expressed recognition of a boundary needed going on identify reasons.  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      Interventions: Supportive therapy, motivational interviewing, CBT   Diagnoses:    ICD-10-CM   1. Depression, major, recurrent, mild  F33.0              Plan: Patient is to use coping as identified in session, utilize his support system, continue to practice self-care and stress reduction.   Long-term goal: Improve management of distressful emotions including his anxiety.        Improve coping and identification and processing of feelings to improve coping with stress.  Short-term goal: To identify and process feelings related to recent stressors.                   Continue to work to be mindful of thoughts associated with increasing his anxiety and work to reframe them and use other coping skill  as identified in session.                   Follow through with self-care such as his continuing to exercise and get adequate rest.                                Assessment of progress:  progressing     Lonni Fischer, [facility]

## 2024-09-05 ENCOUNTER — Other Ambulatory Visit: Payer: Self-pay | Admitting: Psychiatry

## 2024-09-05 DIAGNOSIS — F4001 Agoraphobia with panic disorder: Secondary | ICD-10-CM

## 2024-09-05 DIAGNOSIS — F428 Other obsessive-compulsive disorder: Secondary | ICD-10-CM

## 2024-09-05 DIAGNOSIS — M6289 Other specified disorders of muscle: Secondary | ICD-10-CM

## 2024-09-13 ENCOUNTER — Ambulatory Visit: Admitting: Mental Health

## 2024-09-13 DIAGNOSIS — F428 Other obsessive-compulsive disorder: Secondary | ICD-10-CM

## 2024-09-13 DIAGNOSIS — F330 Major depressive disorder, recurrent, mild: Secondary | ICD-10-CM | POA: Diagnosis not present

## 2024-09-13 NOTE — Progress Notes (Signed)
 [facility] psychotherapy note Name: Brandon White Date: 09/13/24 MRN: [id number] DOB: 1991/10/07 PCP: Seabron Lenis, MD  Time spent:  47 minutes Time start: 10:15 AM time out 11:02AM  Treatment: Individual therapy     Mental Status Exam:    Appearance:    Casual     Behavior:   Appropriate  Motor:   WNL  Speech/Language:    Clear and Coherent  Affect:   Full range   Mood:   Euthymic  Thought process:   Logical, linear, goal directed  Thought content:     WNL  Sensory/Perceptual disturbances:     none  Orientation:   x4  Attention:   Good  Concentration:   Good  Memory:   Intact  Fund of knowledge:    Consistent with age and development  Insight:     Good  Judgment:    Good  Impulse Control:   Good     Reported Symptoms: Anxiety, obsessive thinking, sadness, rumination, challenges with motivation  Risk Assessment: Danger to Self:  No Self-injurious Behavior: No Danger to Others: No Duty to Warn:no Physical Aggression / Violence:No  Access to Firearms a concern: No  Gang Involvement:No  Patient / guardian was educated about steps to take if suicide or homicide risk level increases between visits: yes While future psychiatric events cannot be accurately predicted, the patient does not currently require acute inpatient psychiatric care and does not currently meet [place]  involuntary commitment criteria.  Subjective:  Patient arrived on time for today's session.  Patient shared recent events, how he continues to talk with his ex-girlfriend.  Stated they went on a weekend trip together to the beach, how it was enjoyable sharing experiences.  He stated that he was somewhat disappointed as they were driving him where she stated she wants to continue to have a boundary in their relationship and not being on it as a couple.  He states he will share how she is trying to work on herself, become more self-reliant. He shared feelings of disappointment at that  time but also help he recognizes the relationship is not a formal 1, rather he recognizes they will need to time to let it develop, if that is what she wants.  Stated stated that he knows he wants a relationship with her long-term, has considered future plans, potentially having a child together and other examples were given.  Facilitated his further identifying ways to cope and allow for uncertainty as he acknowledged there is a possibility they may not reunite as a couple.     Interventions: Supportive therapy, motivational interviewing, CBT   Diagnoses:    ICD-10-CM   1. 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 Depression, major, recurrent, mild  F33.0        Plan: Patient is to use coping as identified in session, utilize his support system, continue to practice self-care and stress reduction.   Long-term goal: Improve management of distressful emotions including his anxiety.        Improve coping and identification and processing of feelings to improve coping with stress.  Short-term goal: To identify and process feelings related to recent stressors.                   Continue to work to be mindful of thoughts associated with increasing his anxiety and work to reframe them and use other coping skill  as identified in session.                   Follow through with self-care such as his continuing to exercise and get adequate rest.                                Assessment of progress:  progressing     Lonni Fischer, [facility]

## 2024-09-15 ENCOUNTER — Telehealth: Payer: Self-pay | Admitting: Psychiatry

## 2024-09-15 NOTE — Telephone Encounter (Signed)
 Pt called asking for a refill on his vyvanse  50 mg. Pharmacy is [facility]  on [address]. Next appt is 11/11

## 2024-09-16 ENCOUNTER — Other Ambulatory Visit: Payer: Self-pay

## 2024-09-16 DIAGNOSIS — F902 Attention-deficit hyperactivity disorder, combined type: Secondary | ICD-10-CM

## 2024-09-16 NOTE — Telephone Encounter (Signed)
 Pended

## 2024-09-17 NOTE — Telephone Encounter (Signed)
 Pt lvm requesting Vyvanse  50 mg [facility] [address]    [address]

## 2024-09-17 NOTE — Telephone Encounter (Signed)
 Pended to Dr. Geoffry 10/30

## 2024-09-19 MED ORDER — LISDEXAMFETAMINE DIMESYLATE 50 MG PO CAPS: 50.0000 mg | ORAL_CAPSULE | ORAL | 0 refills | Status: AC

## 2024-09-20 ENCOUNTER — Ambulatory Visit (INDEPENDENT_AMBULATORY_CARE_PROVIDER_SITE_OTHER): Admitting: Mental Health

## 2024-09-20 DIAGNOSIS — F428 Other obsessive-compulsive disorder: Secondary | ICD-10-CM

## 2024-09-20 DIAGNOSIS — F330 Major depressive disorder, recurrent, mild: Secondary | ICD-10-CM

## 2024-09-20 NOTE — Progress Notes (Signed)
 [facility] psychotherapy note Name: Brandon White Date: 09/20/24 MRN: [id number] DOB: 12-05-1990 PCP: Seabron Lenis, MD  Time spent:  55 minutes Time start: 11:05 AM time out 12:00pM  Treatment: Individual therapy     Mental Status Exam:    Appearance:    Casual     Behavior:   Appropriate  Motor:   WNL  Speech/Language:    Clear and Coherent  Affect:   Full range   Mood:   Euthymic  Thought process:   Logical, linear, goal directed  Thought content:     WNL  Sensory/Perceptual disturbances:     none  Orientation:   x4  Attention:   Good  Concentration:   Good  Memory:   Intact  Fund of knowledge:    Consistent with age and development  Insight:     Good  Judgment:    Good  Impulse Control:   Good     Reported Symptoms: Anxiety, obsessive thinking, sadness, rumination, challenges with motivation  Risk Assessment: Danger to Self:  No Self-injurious Behavior: No Danger to Others: No Duty to Warn:no Physical Aggression / Violence:No  Access to Firearms a concern: No  Gang Involvement:No  Patient / guardian was educated about steps to take if suicide or homicide risk level increases between visits: yes While future psychiatric events cannot be accurately predicted, the patient does not currently require acute inpatient psychiatric care and does not currently meet   involuntary commitment criteria.  Subjective:  Patient arrived on time for today's session.  Patient shared progress.  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  He shared how he plans to continue to prepare.  He went on to share how he continues to have challenges in the relationship with his ex-girlfriend.  He stated at this point, they are continuing to communicate however she had told him that she does not want to continue their relationship as a couple again.  He stated that she did this normal form of a text  about 3 or 4 days ago.  Facilitated ways he has tried to cope, where he stated that he is trying to stay busy to occupy his mind, otherwise he stated that he will find himself thinking about the relationship.  He went on to share the relationship with his sister and has been helpful, he shared some details related to conversation they had recently where they identified ways they could be supportive to 1 another, how he plans to specifically make an effort to try asked questions and check on her more often as this was identified as a need.  Facilitated his further identifying ways to cope and care for himself, maintaining some interpersonal boundaries for himself as a way to manage expectations and the relationship with his ex-girlfriend.    Interventions: Supportive therapy, motivational interviewing, CBT   Diagnoses:    ICD-10-CM   1. Depression, major, recurrent, mild  F33.0     2. 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   Long-term goal: Improve management of distressful emotions including his anxiety.        Improve coping and identification and processing of feelings to improve coping with stress.  Short-term goal: To identify and process feelings related to recent stressors.  Continue to work to be mindful of thoughts associated with increasing his anxiety and work to reframe them and use other coping skill                              as identified in session.                   Follow through with self-care such as his continuing to exercise and get adequate rest.                                Assessment of progress:  progressing     Lonni Fischer, [facility]

## 2024-09-27 ENCOUNTER — Ambulatory Visit (INDEPENDENT_AMBULATORY_CARE_PROVIDER_SITE_OTHER): Admitting: Mental Health

## 2024-09-27 DIAGNOSIS — F330 Major depressive disorder, recurrent, mild: Secondary | ICD-10-CM | POA: Diagnosis not present

## 2024-09-27 DIAGNOSIS — F428 Other obsessive-compulsive disorder: Secondary | ICD-10-CM

## 2024-09-27 NOTE — Progress Notes (Signed)
 [facility] psychotherapy note Name: Brandon White Date: 09/27/24 MRN: [id number] DOB: 1991/02/26 PCP: Brandon Lenis, MD  Time spent:  46 minutes Time start: 1:10pm  time out: 1:55pm  Treatment: Individual therapy     Mental Status Exam:    Appearance:    Casual     Behavior:   Appropriate  Motor:   WNL  Speech/Language:    Clear and Coherent  Affect:   Full range   Mood:   Euthymic  Thought process:   Logical, linear, goal directed  Thought content:     WNL  Sensory/Perceptual disturbances:     none  Orientation:   x4  Attention:   Good  Concentration:   Good  Memory:   Intact  Fund of knowledge:    Consistent with age and development  Insight:     Good  Judgment:    Good  Impulse Control:   Good     Reported Symptoms: Anxiety, obsessive thinking, sadness, rumination, challenges with motivation  Risk Assessment: Danger to Self:  No Self-injurious Behavior: No Danger to Others: No Duty to Warn:no Physical Aggression / Violence:No  Access to Firearms a concern: No  Gang Involvement:No  Patient / guardian was educated about steps to take if suicide or homicide risk level increases between visits: yes While future psychiatric events cannot be accurately predicted, the patient does not currently require acute inpatient psychiatric care and does not currently meet [place]  involuntary commitment criteria.  Subjective:  Patient arrived on time for today's session.  Patient shared recent changes, stated that he and his girlfriend are back together in a relationship.  He went on to share how he took the weekend to write her letter outlining their relationship, his feelings and areas of needed change.  He stated that he feels more confident about the relationship restarting at this point, he shared some details of their discussions that support this belief.  He plans to take their relationship more slowly than before, not moving in together and, over time,  gradually allow to be around family members.  Through further guided discovery, he shared ways he feels he is come to understand himself more.  He stated he also recently received verification that his divorce was finalized, time was spent to allow him to process feelings related and efforts to coparent in the relationship with his ex-wife while maintaining some boundaries.    Interventions: Supportive therapy, motivational interviewing, CBT   Diagnoses:    ICD-10-CM   1. Depression, major, recurrent, mild  F33.0     2. 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   Long-term goal: Improve management of distressful emotions including his anxiety.        Improve coping and identification and processing of feelings to improve coping with stress.  Short-term goal: To identify and process feelings related to recent stressors.                   Continue to work to be mindful of thoughts associated with increasing his anxiety and work to reframe them and use other coping skill                              as identified in session.  Follow through with self-care such as his continuing to exercise and get adequate rest.                                Assessment of progress:  progressing     Lonni Fischer, [facility]

## 2024-09-28 ENCOUNTER — Telehealth: Admitting: Psychiatry

## 2024-09-28 ENCOUNTER — Encounter: Payer: Self-pay | Admitting: Psychiatry

## 2024-09-28 DIAGNOSIS — F330 Major depressive disorder, recurrent, mild: Secondary | ICD-10-CM

## 2024-09-28 DIAGNOSIS — M6289 Other specified disorders of muscle: Secondary | ICD-10-CM

## 2024-09-28 DIAGNOSIS — F428 Other obsessive-compulsive disorder: Secondary | ICD-10-CM

## 2024-09-28 DIAGNOSIS — F5105 Insomnia due to other mental disorder: Secondary | ICD-10-CM

## 2024-09-28 DIAGNOSIS — F4001 Agoraphobia with panic disorder: Secondary | ICD-10-CM | POA: Diagnosis not present

## 2024-09-28 DIAGNOSIS — F902 Attention-deficit hyperactivity disorder, combined type: Secondary | ICD-10-CM

## 2024-09-28 MED ORDER — AMPHETAMINE-DEXTROAMPHETAMINE 15 MG PO TABS: ORAL_TABLET | ORAL | 0 refills | Status: AC

## 2024-09-28 MED ORDER — DIAZEPAM 5 MG PO TABS: 5.0000 mg | ORAL_TABLET | Freq: Three times a day (TID) | ORAL | 5 refills | Status: AC | PRN

## 2024-09-28 MED ORDER — FLUOXETINE HCL 40 MG PO CAPS: 80.0000 mg | ORAL_CAPSULE | Freq: Every day | ORAL | 1 refills | Status: AC

## 2024-09-28 MED ORDER — ZOLPIDEM TARTRATE 10 MG PO TABS: 10.0000 mg | ORAL_TABLET | Freq: Every evening | ORAL | 5 refills | Status: AC | PRN

## 2024-09-28 MED ORDER — LISDEXAMFETAMINE DIMESYLATE 50 MG PO CAPS: 50.0000 mg | ORAL_CAPSULE | Freq: Every day | ORAL | 0 refills | Status: AC
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
## 2024-10-04 ENCOUNTER — Ambulatory Visit: Admitting: Mental Health
